# Patient Record
Sex: Female | Born: 1950 | Race: White | Hispanic: No | Marital: Married | Smoking: Former smoker
Health system: Southern US, Community
[De-identification: ages and names within clinical notes are randomized; demographics above are authoritative.]

## PROBLEM LIST (undated history)

## (undated) DIAGNOSIS — N393 Stress incontinence (female) (male): Secondary | ICD-10-CM

## (undated) DIAGNOSIS — F419 Anxiety disorder, unspecified: Secondary | ICD-10-CM

## (undated) DIAGNOSIS — M1990 Unspecified osteoarthritis, unspecified site: Secondary | ICD-10-CM

## (undated) DIAGNOSIS — Z860101 Personal history of adenomatous and serrated colon polyps: Secondary | ICD-10-CM

## (undated) DIAGNOSIS — N8110 Cystocele, unspecified: Secondary | ICD-10-CM

## (undated) DIAGNOSIS — Z86010 Personal history of colonic polyps: Secondary | ICD-10-CM

## (undated) DIAGNOSIS — R112 Nausea with vomiting, unspecified: Secondary | ICD-10-CM

## (undated) DIAGNOSIS — E785 Hyperlipidemia, unspecified: Secondary | ICD-10-CM

## (undated) DIAGNOSIS — K219 Gastro-esophageal reflux disease without esophagitis: Secondary | ICD-10-CM

## (undated) DIAGNOSIS — E079 Disorder of thyroid, unspecified: Secondary | ICD-10-CM

## (undated) DIAGNOSIS — E039 Hypothyroidism, unspecified: Secondary | ICD-10-CM

## (undated) DIAGNOSIS — Z973 Presence of spectacles and contact lenses: Secondary | ICD-10-CM

## (undated) DIAGNOSIS — Z9889 Other specified postprocedural states: Secondary | ICD-10-CM

## (undated) DIAGNOSIS — G4733 Obstructive sleep apnea (adult) (pediatric): Secondary | ICD-10-CM

## (undated) DIAGNOSIS — F32A Depression, unspecified: Secondary | ICD-10-CM

## (undated) HISTORY — DX: Disorder of thyroid, unspecified: E07.9

## (undated) HISTORY — PX: OTHER SURGICAL HISTORY: SHX169

## (undated) HISTORY — PX: LASIK: SHX215

## (undated) HISTORY — PX: MEDIAL PARTIAL KNEE REPLACEMENT: SHX5965

## (undated) HISTORY — PX: REPLACEMENT TOTAL KNEE: SUR1224

## (undated) HISTORY — PX: ABDOMINAL HYSTERECTOMY: SHX81

---

## 1998-10-06 HISTORY — PX: LAPAROSCOPIC ASSISTED VAGINAL HYSTERECTOMY: SHX5398

## 2006-10-06 HISTORY — PX: LAPAROSCOPIC CHOLECYSTECTOMY: SUR755

## 2013-10-06 HISTORY — PX: PARTIAL KNEE ARTHROPLASTY: SHX2174

## 2015-10-07 HISTORY — PX: REPLACEMENT TOTAL KNEE: SUR1224

## 2018-10-06 DIAGNOSIS — Z87898 Personal history of other specified conditions: Secondary | ICD-10-CM

## 2018-10-06 HISTORY — DX: Personal history of other specified conditions: Z87.898

## 2019-10-07 HISTORY — PX: COLONOSCOPY: SHX174

## 2019-11-20 ENCOUNTER — Ambulatory Visit: Payer: Medicare Other | Attending: Internal Medicine

## 2019-11-20 DIAGNOSIS — Z23 Encounter for immunization: Secondary | ICD-10-CM | POA: Insufficient documentation

## 2019-11-20 NOTE — Progress Notes (Signed)
   Covid-19 Vaccination Clinic  Name:  Colleen Conley    MRN: [id number] DOB: March 15, 1951  11/20/2019  [name] was observed post Covid-19 immunization for 15 minutes without incidence. She was provided with Vaccine Information Sheet and instruction to access the V-Safe system.   [name] was instructed to call 911 with any severe reactions post vaccine: Marland Kitchen Difficulty breathing  . Swelling of your face and throat  . A fast heartbeat  . A bad rash all over your body  . Dizziness and weakness    Immunizations Administered    Name Date Dose VIS Date Route   Pfizer COVID-19 Vaccine 11/20/2019  9:00 AM 0.3 mL 09/16/2019 Intramuscular   Manufacturer: [employer], [employer]   Lot: [id number]   NDC: [address]-0

## 2019-12-14 ENCOUNTER — Ambulatory Visit: Payer: Medicare Other | Attending: Internal Medicine

## 2019-12-14 DIAGNOSIS — Z23 Encounter for immunization: Secondary | ICD-10-CM

## 2019-12-14 NOTE — Progress Notes (Signed)
   Covid-19 Vaccination Clinic  Name:  Colleen Conley    MRN: [id number] DOB: 1951-06-20  12/14/2019  [name] was observed post Covid-19 immunization for 15 minutes without incident. She was provided with Vaccine Information Sheet and instruction to access the V-Safe system.   [name] was instructed to call 911 with any severe reactions post vaccine: Marland Kitchen Difficulty breathing  . Swelling of face and throat  . A fast heartbeat  . A bad rash all over body  . Dizziness and weakness   Immunizations Administered    Name Date Dose VIS Date Route   Pfizer COVID-19 Vaccine 12/14/2019  1:03 PM 0.3 mL 09/16/2019 Intramuscular   Manufacturer: [employer], [employer]   Lot: [id number]   NDC: [address]-0

## 2020-02-06 ENCOUNTER — Encounter: Payer: Self-pay | Admitting: Podiatry

## 2020-02-06 ENCOUNTER — Other Ambulatory Visit: Payer: Self-pay

## 2020-02-06 ENCOUNTER — Ambulatory Visit (INDEPENDENT_AMBULATORY_CARE_PROVIDER_SITE_OTHER): Payer: Medicare Other | Admitting: Podiatry

## 2020-02-06 VITALS — Temp 98.0°F

## 2020-02-06 DIAGNOSIS — M79675 Pain in left toe(s): Secondary | ICD-10-CM

## 2020-02-06 DIAGNOSIS — L600 Ingrowing nail: Secondary | ICD-10-CM | POA: Diagnosis not present

## 2020-02-07 ENCOUNTER — Encounter: Payer: Self-pay | Admitting: Podiatry

## 2020-02-07 NOTE — Progress Notes (Signed)
  Subjective:  Patient ID: Colleen Conley, female    DOB: [birth year],  MRN: [id number]  Chief Complaint  Patient presents with  . Nail Problem    Patient presents today for ingrown toenail left hallux medial border x months.    69 y.o. female presents with the above complaint.  Patient presents with complaint of left hallux medial ingrown that has been causing a lot of pain.  Patient states that this has been going on for quite some time.  It is tender to touch.  SHe has not tried any other treatment options.  She would like to have the cornea removed.  She denies any other acute complaints.   Review of Systems: Negative except as noted in the HPI. Denies N/V/F/Ch.  No past medical history on file.  Current Outpatient Medications:  .  famotidine (PEPCID) 40 MG tablet, Take by mouth., Disp: , Rfl:  .  levothyroxine (EUTHYROX) 100 MCG tablet, Take 1 tablet by mouth once daily, Disp: , Rfl:  .  LORazepam (ATIVAN) 0.5 MG tablet, Take by mouth., Disp: , Rfl:  .  sertraline (ZOLOFT) 25 MG tablet, Take 1 tablet by mouth once daily, Disp: , Rfl:  .  simvastatin (ZOCOR) 20 MG tablet, Take 1 tablet by mouth nightly, Disp: , Rfl:  .  omeprazole (PRILOSEC OTC) 20 MG tablet, Take by mouth., Disp: , Rfl:   Social History   Tobacco Use  Smoking Status Former Smoker  Smokeless Tobacco Never Used    No Known Allergies Objective:   Vitals:   02/06/20 1409  Temp: 98 F (36.7 C)   There is no height or weight on file to calculate BMI. Constitutional Well developed. Well nourished.  Vascular Dorsalis pedis pulses palpable bilaterally. Posterior tibial pulses palpable bilaterally. Capillary refill normal to all digits.  No cyanosis or clubbing noted. Pedal hair growth normal.  Neurologic Normal speech. Oriented to person, place, and time. Epicritic sensation to light touch grossly present bilaterally.  Dermatologic Painful ingrowing nail at medial nail borders of the hallux nail left. No  other open wounds. No skin lesions.  Orthopedic: Normal joint ROM without pain or crepitus bilaterally. No visible deformities. No bony tenderness.   Radiographs: None Assessment:   1. Ingrown left big toenail   2. Great toe pain, left    Plan:  Patient was evaluated and treated and all questions answered.  Ingrown Nail, left -Patient elects to proceed with minor surgery to remove ingrown toenail removal today. Consent reviewed and signed by patient. -Ingrown nail excised. See procedure note. -Educated on post-procedure care including soaking. Written instructions provided and reviewed. -Patient to follow up in 2 weeks for nail check.  Procedure: Excision of Ingrown Toenail Location: Left 1st toe medial nail borders. Anesthesia: Lidocaine 1% plain; 1.5 mL and Marcaine 0.5% plain; 1.5 mL, digital block. Skin Prep: Betadine. Dressing: Silvadene; telfa; dry, sterile, compression dressing. Technique: Following skin prep, the toe was exsanguinated and a tourniquet was secured at the base of the toe. The affected nail border was freed, split with a nail splitter, and excised. Chemical matrixectomy was then performed with phenol and irrigated out with alcohol. The tourniquet was then removed and sterile dressing applied. Disposition: Patient tolerated procedure well. Patient to return in 2 weeks for follow-up.   No follow-ups on file.

## 2020-11-19 ENCOUNTER — Other Ambulatory Visit: Payer: Self-pay | Admitting: Family Medicine

## 2020-11-19 DIAGNOSIS — Z1231 Encounter for screening mammogram for malignant neoplasm of breast: Secondary | ICD-10-CM

## 2020-12-06 ENCOUNTER — Ambulatory Visit
Admission: RE | Admit: 2020-12-06 | Discharge: 2020-12-06 | Disposition: A | Discharge status: Home / Self Care | Payer: Medicare Other | Source: Ambulatory Visit | Attending: Family Medicine | Admitting: Family Medicine

## 2020-12-06 ENCOUNTER — Other Ambulatory Visit: Payer: Self-pay

## 2020-12-06 DIAGNOSIS — Z1231 Encounter for screening mammogram for malignant neoplasm of breast: Secondary | ICD-10-CM | POA: Diagnosis not present

## 2021-06-13 ENCOUNTER — Encounter: Payer: Self-pay | Admitting: Family Medicine

## 2021-06-13 ENCOUNTER — Ambulatory Visit (INDEPENDENT_AMBULATORY_CARE_PROVIDER_SITE_OTHER): Payer: Medicare Other | Admitting: Family Medicine

## 2021-06-13 ENCOUNTER — Other Ambulatory Visit: Payer: Self-pay

## 2021-06-13 VITALS — BP 118/75 | HR 66 | Ht 68.0 in | Wt 174.0 lb

## 2021-06-13 DIAGNOSIS — K219 Gastro-esophageal reflux disease without esophagitis: Secondary | ICD-10-CM | POA: Insufficient documentation

## 2021-06-13 DIAGNOSIS — E785 Hyperlipidemia, unspecified: Secondary | ICD-10-CM | POA: Insufficient documentation

## 2021-06-13 DIAGNOSIS — E039 Hypothyroidism, unspecified: Secondary | ICD-10-CM | POA: Insufficient documentation

## 2021-06-13 DIAGNOSIS — F419 Anxiety disorder, unspecified: Secondary | ICD-10-CM | POA: Insufficient documentation

## 2021-06-13 DIAGNOSIS — N8111 Cystocele, midline: Secondary | ICD-10-CM

## 2021-06-13 DIAGNOSIS — F32A Depression, unspecified: Secondary | ICD-10-CM | POA: Insufficient documentation

## 2021-06-13 NOTE — Progress Notes (Addendum)
   Subjective:    Patient ID: Colleen Conley is a 70 y.o. 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 Starting to leak urine with sneezing or coughing. Has large bulge if not using her pessary. Has tried [employer]. She has h/o using a Number 5 pessary cube for a long time and would like that replaced. She was told that she was not a candidate for mesh. She is not sexually active. SVD x 3, largest baby 8.5 lbs.  Review of Systems  Constitutional:  Negative for chills and fever.  Respiratory:  Negative for shortness of breath.   Cardiovascular:  Negative for chest pain.  Gastrointestinal:  Negative for abdominal pain, nausea and vomiting.  Genitourinary:  Negative for dysuria.  Skin:  Negative for rash.     Objective:    BP 118/75   Pulse 66   Ht 5\' 8"  (1.727 m)   Wt 174 lb (78.9 kg)   BMI 26.46 kg/m  Physical Exam Exam conducted with a chaperone present.  Constitutional:      General: She is not in acute distress.    Appearance: She is well-developed.  HENT:     Head: Normocephalic and atraumatic.  Eyes:     General: No scleral icterus. Cardiovascular:     Rate and Rhythm: Normal rate.  Pulmonary:     Effort: Pulmonary effort is normal.  Abdominal:     Palpations: Abdomen is soft.  Genitourinary:    Comments: Large bulge noted at introitus, c/w cystocele and probable rectocele and side walls seem to collapse in as well. Uterus/cervix are surgically absent. Musculoskeletal:     Cervical back: Neck supple.  Skin:    General: Skin is warm and dry.  Neurological:     Mental Status: She is alert and oriented to person, place, and time.        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       Relevant Orders   Ambulatory referral to Urogynecology    Return in about 3 months (around 09/12/2021).  14/05/2021 06/13/2021 3:04 PM

## 2021-06-13 NOTE — Progress Notes (Signed)
Would like a new measurement for a new pessary

## 2021-06-13 NOTE — Assessment & Plan Note (Addendum)
+   rectocele and possible vaginal vault prolapse--she is happy with her pessary (will order a new one) and will send to URO/GYN for discussion of other treatment options.

## 2021-08-08 NOTE — Progress Notes (Signed)
[facility] New Patient Evaluation and Consultation  Referring Provider: Reva Bores, MD PCP: Jerl Mina, MD Date of Service: 08/09/2021  SUBJECTIVE Chief Complaint: New Patient (Initial Visit) Colleen Conley is a 70 y.o. female here for a consult on prolapse. Pt would like to explore other options.) Treatment for prolapse History of Present Illness: Colleen Conley is a 70 y.o. White or Caucasian female seen in consultation at the request of Dr. Shawnie Pons for evaluation of prolapse and incontinence.    Review of records from Dr Shawnie Pons significant for: S/p LAVH. Has leakage of urine with cough or sneeze. She is using a #5 cube pessary  Urinary Symptoms: Do you leak urine? Yes  When do you leak urine? cough/sneeze   exercise   movement to bathroom  How many times do you leak in one day?  1 to 2  For leakage protection, do you use liners/minipads  If using leakage protection, how many do you use in one day? one  Are you bothered by your leakage? Yes  How many times do you urinate in the daytime? 2 to 3  How many times do you wake up at night to urinate? 1 to 2  When you urinate, does it feel like you empty your bladder completely? Yes  Do you use a catheter to help empty your bladder? No   When urinating, she feels dribbling after finishing  UTIs:  0  UTI's in the last year.   Denies history of blood in urine and kidney or bladder stones  Pelvic Organ Prolapse Symptoms:                  She Admits to a feeling of a bulge the vaginal area. It has been present for 20 years.  She Admits to seeing a bulge.  This bulge is bothersome. Currently using cube pessary. Takes it out every day.   Bowel Symptom: How often do you have a bowel movement? 1 to 2 times day  What is the consistency of your stools? soft  Do you strain to empty your bowels? Yes  Do you have to push on your rectum or vagina to be able to empty your bowels? No  Do you have difficulty completely  emptying your rectum during a bowel movement? Yes  Do you ever leak bowel contents? Yes  How often do you leak bowel contents? once in awhile after eating tomato based food  When you leak stool, what is the consistency? liquid  Please enter the date of your last colonoscopy 2021  What were the results of your last colonoscopy? some polyps    Sexual Function Sexually active: no.   Pelvic Pain Denies pelvic pain  Past Medical History:  Past Medical History:  Diagnosis Date   Thyroid disease      Past Surgical History:   Past Surgical History:  Procedure Laterality Date   ABDOMINAL HYSTERECTOMY     MEDIAL PARTIAL KNEE REPLACEMENT     REPLACEMENT TOTAL KNEE       Past OB/GYN History: OB History  Gravida Para Term Preterm AB Living  3 3       3   SAB IAB Ectopic Multiple Live Births          3    # Outcome Date GA Lbr Len/2nd Weight Sex Delivery Anes PTL Lv  3 Para           2 Para  1 Para             Vaginal deliveries: 3,  Forceps/ Vacuum deliveries: 0, Cesarean section: 0 S/p hysterectomy   Medications: She has a current medication list which includes the following prescription(s): levothyroxine, omeprazole, sertraline, and simvastatin.   Allergies: Patient has No Known Allergies.   Social History:  Social History   Tobacco Use   Smoking status: Former   Smokeless tobacco: Never  Building services engineer Use: Never used  Substance Use Topics   Alcohol use: Yes    Comment: social drinker   Drug use: Never    What is your current relationship status? Married  Who do you live with? Fabio Asa (spouse)  Are you currently working? No  Do you exercise regularly? No  Have you ever been emotionally, physically or sexually abused? No  Do you feel safe in your current relationship? Yes    Family History:   Family History  Problem Relation Age of Onset   Breast cancer Maternal Aunt    Breast cancer Other      Review of Systems: Review of  Systems  Constitutional:  Negative for fever, malaise/fatigue and weight loss.  Respiratory:  Negative for cough, shortness of breath and wheezing.   Cardiovascular:  Negative for chest pain, palpitations and leg swelling.  Gastrointestinal:  Negative for abdominal pain and blood in stool.  Genitourinary:  Negative for dysuria.  Musculoskeletal:  Negative for myalgias.  Skin:  Negative for rash.  Neurological:  Negative for dizziness and headaches.  Endo/Heme/Allergies:  Does not bruise/bleed easily.  Psychiatric/Behavioral:  Negative for depression. The patient is not nervous/anxious.     OBJECTIVE Physical Exam: Vitals:   08/09/21 1039  BP: (!) 147/79  Pulse: (!) 51  Weight: 172 lb (78 kg)  Height: 5\' 8"  (1.727 m)    Physical Exam Constitutional:      General: She is not in acute distress. Pulmonary:     Effort: Pulmonary effort is normal.  Abdominal:     General: There is no distension.     Palpations: Abdomen is soft.     Tenderness: There is no abdominal tenderness. There is no rebound.  Musculoskeletal:        General: No swelling. Normal range of motion.  Skin:    General: Skin is warm and dry.     Findings: No rash.  Neurological:     Mental Status: She is alert and oriented to person, place, and time.  Psychiatric:        Mood and Affect: Mood normal.        Behavior: Behavior normal.     GU / Detailed Urogynecologic Evaluation:  Pelvic Exam: Normal external female genitalia; Bartholin's and Skene's glands normal in appearance; urethral meatus normal in appearance, no urethral masses or discharge.   CST: negative  s/p hysterectomy: Speculum exam reveals normal vaginal mucosa with  atrophy and normal vaginal cuff.  Adnexa no mass, fullness, tenderness.     Pelvic floor strength II/V, puborectalis III/V external anal sphincter IV/V  Pelvic floor musculature: Right levator non-tender, Right obturator non-tender, Left levator non-tender, Left obturator  non-tender  POP-Q:   POP-Q  -2                                            Aa   -2  Ba  -5                                              C   5                                            Gh  4                                            Pb  7.5                                            tvl   2                                            Ap  2                                            Bp                                                 D     Rectal Exam:  Normal sphincter tone, moderate distal rectocele, enterocoele present, no rectal masses  Post-Void Residual (PVR) by Bladder Scan: In order to evaluate bladder emptying, we discussed obtaining a postvoid residual and she agreed to this procedure.  Procedure: The ultrasound unit was placed on the patient's abdomen in the suprapubic region after the patient had voided. A PVR of 8 ml was obtained by bladder scan.  Laboratory Results: POC urine: negative   ASSESSMENT AND PLAN Ms. Sutphin is a 70 y.o. with:  1. Prolapse of posterior vaginal wall   2. Vaginal vault prolapse after hysterectomy   3. SUI (stress urinary incontinence, female)   4. Urinary frequency    Stage I anterior, Stage III posterior, Stage I apical prolapse - For treatment of pelvic organ prolapse, we discussed options for management including expectant management, conservative management, and surgical management, such as Kegels, a pessary, pelvic floor physical therapy, and specific surgical procedures. - She is interested in surgery. We discussed two options for prolapse repair:  1) vaginal repair without mesh - Pros - safer, no mesh complications - Cons - not as strong as mesh repair, higher risk of recurrence  2) laparoscopic repair with mesh - Pros - stronger, better long-term success - Cons - risks of mesh implant (erosion into vagina or bladder, adhering to the rectum, pain) - these risks are lower than  with a vaginal mesh but still exist - Handouts provided on both options.   2. SUI - For treatment of stress urinary incontinence,  non-surgical options include expectant  management, weight loss, physical therapy, as well as a pessary.  Surgical options include a midurethral sling, Burch urethropexy, and transurethral injection of a bulking agent. - She will undergo urodynamic testing to demonstrate leakage to see if she is a candidate for an anti-incontinence procedure  3. Urgency/ frequency - less of an issue, rare  Return for urodynamic testing  Marguerita Beards, MD

## 2021-08-09 ENCOUNTER — Other Ambulatory Visit: Payer: Self-pay

## 2021-08-09 ENCOUNTER — Encounter: Payer: Self-pay | Admitting: Obstetrics and Gynecology

## 2021-08-09 ENCOUNTER — Ambulatory Visit (INDEPENDENT_AMBULATORY_CARE_PROVIDER_SITE_OTHER): Payer: Medicare Other | Admitting: Obstetrics and Gynecology

## 2021-08-09 VITALS — BP 147/79 | HR 51 | Ht 68.0 in | Wt 172.0 lb

## 2021-08-09 DIAGNOSIS — R350 Frequency of micturition: Secondary | ICD-10-CM

## 2021-08-09 DIAGNOSIS — N993 Prolapse of vaginal vault after hysterectomy: Secondary | ICD-10-CM

## 2021-08-09 DIAGNOSIS — N393 Stress incontinence (female) (male): Secondary | ICD-10-CM | POA: Diagnosis not present

## 2021-08-09 DIAGNOSIS — N816 Rectocele: Secondary | ICD-10-CM

## 2021-08-09 LAB — POCT URINALYSIS DIPSTICK
Appearance: ABNORMAL
Glucose, UA: NEGATIVE
Ketones, UA: NEGATIVE
Leukocytes, UA: NEGATIVE
Nitrite, UA: NEGATIVE
Protein, UA: NEGATIVE
Spec Grav, UA: >=1.03 — AB (ref 1.010–1.025)
Urobilinogen, UA: 0.2 E.U./dL
pH, UA: 5.5 (ref 5.0–8.0)

## 2021-08-09 NOTE — Patient Instructions (Signed)

## 2021-09-24 ENCOUNTER — Ambulatory Visit (INDEPENDENT_AMBULATORY_CARE_PROVIDER_SITE_OTHER): Payer: Medicare Other | Admitting: Obstetrics and Gynecology

## 2021-09-24 ENCOUNTER — Other Ambulatory Visit: Payer: Self-pay

## 2021-09-24 VITALS — BP 136/79 | HR 48

## 2021-09-24 DIAGNOSIS — R350 Frequency of micturition: Secondary | ICD-10-CM | POA: Diagnosis not present

## 2021-09-24 LAB — POCT URINALYSIS DIPSTICK
Appearance: NORMAL
Bilirubin, UA: NEGATIVE
Blood, UA: NEGATIVE
Glucose, UA: NEGATIVE
Ketones, UA: NEGATIVE
Leukocytes, UA: NEGATIVE
Nitrite, UA: NEGATIVE
Protein, UA: NEGATIVE
Spec Grav, UA: 1.025 (ref 1.010–1.025)
Urobilinogen, UA: 0.2 E.U./dL
pH, UA: 5 (ref 5.0–8.0)

## 2021-09-24 NOTE — Patient Instructions (Signed)

## 2021-09-27 NOTE — Progress Notes (Signed)
[facility] Urodynamics Procedure  Referring Physician: Jerl Mina, MD Date of Procedure: 09/24/2021  Colleen Conley is a 70 y.o. female who presents for urodynamic evaluation. Indication(s) for study: mixed incontinence  Vital Signs: BP 136/79    Pulse (!) 48   Laboratory Results: A catheterized urine specimen revealed:  POC urine: negative  Voiding Diary: Not performed  Procedure Timeout:  The correct patient was verified and the correct procedure was verified. The patient was in the correct position and safety precautions were reviewed based on at the patient's history.  Urodynamic Procedure A 35F dual lumen urodynamics catheter was placed under sterile conditions into the patient's bladder. A 35F catheter was placed into the rectum in order to measure abdominal pressure. EMG patches were placed in the appropriate position.  All connections were confirmed and calibrations/adjusted made. Saline was instilled into the bladder through the dual lumen catheters.  Cough/valsalva pressures were measured periodically during filling.  Patient was allowed to void.  The bladder was then emptied of its residual.  UROFLOW: Revealed a Qmax of 1 mL/sec.  She voided 2 mL and had a residual of 20 mL.  Unable to comment on flow pattern due to low voided volume.   CMG: This was performed with sterile water in the sitting position at a fill rate of 30 mL/min.    First sensation of fullness was 129 mLs,  First urge was 159 mLs,  Strong urge was 197 mLs and  Capacity was 279 mLs  Stress incontinence was not demonstrated Highest negative Barrier CLPP was 117 cmH20 at capacity in the standing position Highest negative Barrier VLPP was 36 cmH20 at capacity  Detrusor function was normal, with no phasic contractions seen.   Compliance:  normal. End fill detrusor pressure was 5.5cmH20.  Calculated compliance was 139mL/cmH20  UPP: MUCP with barrier reduction was 51.5 cm of water.     MICTURITION STUDY: Voiding was performed with reduction using scopettes in the sitting position.  Pdet at Qmax was 30 cm of water.  Qmax was 13 mL/sec.  It was a interrupted pattern.  She voided 261 mL and had a residual of 18 mL.  It was a volitional void, sustained detrusor contraction was present and abdominal straining was present  EMG: This was performed with patches.  She had voluntary contractions, recruitment with fill was present and urethral sphincter was relaxed with void.  The details of the procedure with the study tracings have been scanned into EPIC.   Urodynamic Impression:  1. Sensation was normal; capacity was reduced 2. Stress Incontinence was not demonstrated. 3. Detrusor Overactivity was not demonstrated. 4. Emptying was normal with a normal PVR, a sustained detrusor contraction present,  abdominal straining present, normal urethral sphincter activity on EMG.  Plan: - The patient will follow up  to discuss the findings and treatment options.

## 2021-10-25 ENCOUNTER — Encounter: Payer: Self-pay | Admitting: Obstetrics and Gynecology

## 2021-10-25 ENCOUNTER — Other Ambulatory Visit: Payer: Self-pay

## 2021-10-25 ENCOUNTER — Ambulatory Visit (INDEPENDENT_AMBULATORY_CARE_PROVIDER_SITE_OTHER): Payer: Medicare Other | Admitting: Obstetrics and Gynecology

## 2021-10-25 VITALS — BP 116/77 | HR 95

## 2021-10-25 DIAGNOSIS — N816 Rectocele: Secondary | ICD-10-CM | POA: Diagnosis not present

## 2021-10-25 DIAGNOSIS — N993 Prolapse of vaginal vault after hysterectomy: Secondary | ICD-10-CM

## 2021-10-25 NOTE — Patient Instructions (Addendum)
General Surgical Risks: For all procedures, there are risks of bleeding, infection, damage to surrounding organs including but not limited to bowel, bladder, blood vessels, ureters and nerves, and need for further surgery if an injury were to occur. These risks are all low with minimally invasive surgery.   There are risks of numbness and weakness at any body site or buttock/rectal pain.  It is possible that baseline pain can be worsened by surgery, either with or without mesh. If surgery is vaginal, there is also a low risk of possible conversion to laparoscopy or open abdominal incision where indicated. Very rare risks include blood transfusion, blood clot, heart attack, pneumonia, or death.   There is also a risk of short-term postoperative urinary retention with need to use a catheter. About half of patients need to go home from surgery with a catheter, which is then later removed in the office. The risk of long-term need for a catheter is very low. There is also a risk of worsening of overactive bladder.   Prolapse (with or without mesh): Risk factors for surgical failure  include things that put pressure on your pelvis and the surgical repair, including obesity, chronic cough, and heavy lifting or straining (including lifting children or adults, straining on the toilet, or lifting heavy objects such as furniture or anything weighing >25 lbs. Risks of recurrence is 20-30% with vaginal native tissue repair and a less than 10% with sacrocolpopexy with mesh.    Sacrocolpopexy: Mesh implants may provide more prolapse support, but do have some unique risks to consider. It is important to understand that mesh is permanent and cannot be easily removed. Risks of abdominal sacrocolpopexy mesh include mesh exposure (~3-6%), painful intercourse (recent studies show lower rates after surgery compared to before, with ~5-8% risk of new onset), and very rare risks of bowel or bladder injury or infection (<1%). The  risk of mesh exposure is more likely in a woman with risks for poor healing (prior radiation, poorly controlled diabetes, or immunocompromised). The risk of new or worsened chronic pain after mesh implant is more common in women with baseline chronic pain and/or poorly controlled anxiety or depression. There is an FDA safety notification on vaginal mesh procedures for prolapse but NOT abdominal mesh procedures and therefore does not apply to your surgery. We have extensive experience and training with mesh placement and we have close postoperative follow up to identify any potential complications from mesh.     POST OPERATIVE INSTRUCTIONS  General Instructions Recovery (not bed rest) will last approximately 6 weeks Walking is encouraged, but refrain from strenuous exercise/ housework/ heavy lifting. No lifting >10lbs  Nothing in the vagina- NO intercourse, tampons or douching Bathing:  Do not submerge in water (NO swimming, bath, hot tub, etc) until after your postop visit. You can shower starting the day after surgery.  No driving until you are not taking narcotic pain medicine and until your pain is well enough controlled that you can slam on the breaks or make sudden movements if needed.   Taking your medications Please take your acetaminophen and ibuprofen on a schedule for the first 48 hours. Take 600mg  ibuprofen, then take 500mg  acetaminophen 3 hours later, then continue to alternate ibuprofen and acetaminophen. That way you are taking each type of medication every 6 hours. Take the prescribed narcotic (oxycodone, tramadol, etc) as needed, with a maximum being every 4 hours.  Take a stool softener daily to keep your stools soft and preventing you from straining.  If you have diarrhea, you decrease your stool softener. This is explained more below. We have prescribed you Miralax.  Reasons to Call the Nurse (see last page for phone numbers) Heavy Bleeding (changing your pad every 1-2  hours) Persistent nausea/vomiting Fever (100.4 degrees or more) Incision problems (pus or other fluid coming out, redness, warmth, increased pain)  Things to Expect After Surgery Mild to Moderate pain is normal during the first day or two after surgery. If prescribed, take Ibuprofen or Tylenol first and use the stronger medicine for break-through pain. You can overlap these medicines because they work differently.   Constipation   To Prevent Constipation:  Eat a well-balanced diet including protein, grains, fresh fruit and vegetables.  Drink plenty of fluids. Walk regularly.  Depending on specific instructions from your physician: take Miralax daily and additionally you can add a stool softener (colace/ docusate) and fiber supplement. Continue as long as you're on pain medications.   To Treat Constipation:  If you do not have a bowel movement in 2 days after surgery, you can take 2 Tbs of Milk of Magnesia 1-2 times a day until you have a bowel movement. If diarrhea occurs, decrease the amount or stop the laxative. If no results with Milk of Magnesia, you can drink a bottle of magnesium citrate which you can purchase over the counter.  Fatigue:  This is a normal response to surgery and will improve with time.  Plan frequent rest periods throughout the day.  Gas Pain:  This is very common but can also be very painful! Drink warm liquids such as herbal teas, bouillon or soup. Walking will help you pass more gas.  Mylicon or Gas-X can be taken over the counter.  Leaking Urine:  Varying amounts of leakage may occur after surgery.  This should improve with time. Your bladder needs at least 3 months to recover from surgery. If you leak after surgery, be sure to mention this to your doctor at your post-op visit. If you were taking medications for overactive bladder prior to surgery, be sure to restart the medications immediately after surgery.  Incisions: If you have incisions on your abdomen, the skin  glue will dissolve on its own over time. It is ok to gently rinse with soap and water over these incisions but do not scrub.  Catheter Approximately 50% of patients are unable to urinate after surgery and need to go home with a catheter. This allows your bladder to rest so it can return to full function. If you go home with a catheter, the office will call to set up a voiding trial a few days after surgery. For most patients, by this visit, they are able to urinate on their own. Long term catheter use is rare.   Return to Work  As work demands and recovery times vary widely, it is hard to predict when you will want to return to work. If you have a desk job with no strenuous physical activity, and if you would like to return sooner than generally recommended, discuss this with your provider or call our office.   Post op concerns  For non-emergent issues, please call the Urogynecology Nurse. Please leave a message and someone will contact you within one business day.  You can also send a message through MyChart.   AFTER HOURS (After 5:00 PM and on weekends):  For urgent matters that cannot wait until the next business day. Call our office [phone] and connect to the doctor on  call.  Please reserve this for important issues.   **FOR ANY TRUE EMERGENCY ISSUES CALL 911 OR GO TO THE NEAREST EMERGENCY ROOM.** Please inform our office or the doctor on call of any emergency.     APPOINTMENTS: Call [phone]

## 2021-10-25 NOTE — Progress Notes (Signed)
[facility] Return Visit  SUBJECTIVE  History of Present Illness: Colleen Conley is a 71 y.o. female seen in follow-up for to discuss surgery after urodynamic testing.   Urodynamic Impression:  1. Sensation was normal; capacity was reduced 2. Stress Incontinence was not demonstrated. 3. Detrusor Overactivity was not demonstrated. 4. Emptying was normal with a normal PVR, a sustained detrusor contraction present,  abdominal straining present, normal urethral sphincter activity on EMG.  Past Medical History: Patient  has a past medical history of Thyroid disease.   Past Surgical History: She  has a past surgical history that includes Abdominal hysterectomy; Replacement total knee; and Medial partial knee replacement.   Medications: She has a current medication list which includes the following prescription(s): levothyroxine, omeprazole, sertraline, and simvastatin.   Allergies: Patient has No Known Allergies.   Social History: Patient  reports that she has quit smoking. She has never used smokeless tobacco. She reports current alcohol use. She reports that she does not use drugs.      OBJECTIVE     Physical Exam: Vitals:   10/25/21 0838  BP: 116/77  Pulse: 95   Gen: No apparent distress, A&O x 3.  Detailed Urogynecologic Evaluation:  Deferred. Prior exam showed:  POP-Q (08/09/21):    POP-Q   -2                                            Aa   -2                                           Ba   -5                                              C    5                                            Gh   4                                            Pb   7.5                                            tvl    2                                            Ap   2                                            Bp  D         ASSESSMENT AND PLAN    [name] is a 71 y.o. 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 Prolapse of posterior vaginal wall     Plan for surgery: Exam under anesthesia, robotic sacrocolpopexy, cystoscopy  - We reviewed the patient's specific anatomic and functional findings, with the assistance of diagrams, and together finalized the above procedure. The planned surgical procedures were discussed along with the surgical risks outlined below, which were also provided on a detailed handout. Additional treatment options including expectant management, conservative management, medical management were discussed where appropriate.  We reviewed the benefits and risks of each treatment option.  - Urodynamic testing did not demonstrate need for anti-incontinence procedure.   General Surgical Risks: For all procedures, there are risks of bleeding, infection, damage to surrounding organs including but not limited to bowel, bladder, blood vessels, ureters and nerves, and need for further surgery if an injury were to occur. These risks are all low with minimally invasive surgery.   There are risks of numbness and weakness at any body site or buttock/rectal pain.  It is possible that baseline pain can be worsened by surgery, either with or without mesh. If surgery is vaginal, there is also a low risk of possible conversion to laparoscopy or open abdominal incision where indicated. Very rare risks include blood transfusion, blood clot, heart attack, pneumonia, or death.   There is also a risk of short-term postoperative urinary retention with need to use a catheter. About half of patients need to go home from surgery with a catheter, which is then later removed in the office. The risk of long-term need for a catheter is very low. There is also a risk of worsening of overactive bladder.   Prolapse (with or without mesh): Risk factors for surgical failure  include things that put pressure on your pelvis and the surgical repair, including obesity, chronic cough, and heavy lifting  or straining (including lifting children or adults, straining on the toilet, or lifting heavy objects such as furniture or anything weighing >25 lbs. Risks of recurrence is 20-30% with vaginal native tissue repair and a less than 10% with sacrocolpopexy with mesh.    Sacrocolpopexy: Mesh implants may provide more prolapse support, but do have some unique risks to consider. It is important to understand that mesh is permanent and cannot be easily removed. Risks of abdominal sacrocolpopexy mesh include mesh exposure (~3-6%), painful intercourse (recent studies show lower rates after surgery compared to before, with ~5-8% risk of new onset), and very rare risks of bowel or bladder injury or infection (<1%). The risk of mesh exposure is more likely in a woman with risks for poor healing (prior radiation, poorly controlled diabetes, or immunocompromised). The risk of new or worsened chronic pain after mesh implant is more common in women with baseline chronic pain and/or poorly controlled anxiety or depression. There is an FDA safety notification on vaginal mesh procedures for prolapse but NOT abdominal mesh procedures and therefore does not apply to your surgery. We have extensive experience and training with mesh placement and we have close postoperative follow up to identify any potential complications from mesh.    - For preop Visit:  She is required to have a visit within 30 days of her surgery.   Today we reviewed pre-operative preparation, peri-operative expectations, and post-operative instructions/recovery.  She was provided with instructional handouts. She understands not to take aspirin (>81mg ) or NSAIDs  7 days prior to surgery. Prescriptions provided for: Oxycodone 5mg , Ibuprofen 600mg , Tylenol 500mg , Miralax. These prescriptions will be sent prior to surgery.  - Medical clearance: not required  - Anticoagulant use: No - Medicaid Hysterectomy form: No - Accepts blood transfusion: Yes - Expected  length of stay: outpatient  Request sent for surgery scheduling.   , MD  Time spent: I spent 30 minutes dedicated to the care of this patient on the date of this encounter to include pre-visit review of records, face-to-face time with the patient and post visit documentation.

## 2021-11-25 NOTE — Progress Notes (Signed)
[facility] Pre-Operative visit  Subjective Chief Complaint: Colleen Conley presents for a preoperative encounter.   History of Present Illness: Colleen Conley is a 71 y.o. female who presents for preoperative visit.  She is scheduled to undergo Robotic assisted laparoscopic sacrocolpopexy, cystoscopy, possible posterior repair and perineorrhaphy on 12/17/21.  Her symptoms include vaginal bulge, and she was was found to have Stage I anterior, Stage III posterior, Stage I apical prolapse.  Urodynamics showed: 1. Sensation was normal; capacity was reduced 2. Stress Incontinence was not demonstrated. 3. Detrusor Overactivity was not demonstrated. 4. Emptying was normal with a normal PVR, a sustained detrusor contraction present,  abdominal straining present, normal urethral sphincter activity on EMG.  Past Medical History:  Diagnosis Date   Thyroid disease      Past Surgical History:  Procedure Laterality Date   ABDOMINAL HYSTERECTOMY     MEDIAL PARTIAL KNEE REPLACEMENT     REPLACEMENT TOTAL KNEE      has No Known Allergies.   Family History  Problem Relation Age of Onset   Breast cancer Maternal Aunt    Breast cancer Other     Social History   Tobacco Use   Smoking status: Former   Smokeless tobacco: Never  Vaping Use   Vaping Use: Never used  Substance Use Topics   Alcohol use: Yes    Comment: social drinker   Drug use: Never     Review of Systems was negative for a full 10 system review except as noted in the History of Present Illness.   Current Outpatient Medications:    levothyroxine (SYNTHROID) 100 MCG tablet, Take 1 tablet by mouth once daily, Disp: , Rfl:    omeprazole (PRILOSEC OTC) 20 MG tablet, Take by mouth., Disp: , Rfl:    sertraline (ZOLOFT) 25 MG tablet, Take 1 tablet by mouth once daily, Disp: , Rfl:    simvastatin (ZOCOR) 20 MG tablet, Take 1 tablet by mouth nightly, Disp: , Rfl:    Objective Vitals:   11/26/21 1301  BP:  112/66  Pulse: 79    Gen: NAD CV: S1 S2 RRR Lungs: Clear to auscultation bilaterally Abd: soft, nontender   Previous Pelvic Exam showed: POP-Q (08/09/21):    POP-Q   -2                                            Aa   -2                                           Ba   -5                                              C    5                                            Gh   4  Pb   7.5                                            tvl    2                                            Ap   2                                            Bp                                                  D          Assessment/ Plan  Assessment: The patient is a 71 y.o. year old scheduled to undergo Robotic assisted laparoscopic sacrocolpopexy, cystoscopy, possible posterior repair and perineorrhaphy. Verbal consent was obtained for these procedures.  Plan: General Surgical Consent: The patient has previously been counseled on alternative treatments, and the decision by the patient and provider was to proceed with the procedure listed above.  For all procedures, there are risks of bleeding, infection, damage to surrounding organs including but not limited to bowel, bladder, blood vessels, ureters and nerves, and need for further surgery if an injury were to occur. These risks are all low with minimally invasive surgery.   There are risks of numbness and weakness at any body site or buttock/rectal pain.  It is possible that baseline pain can be worsened by surgery, either with or without mesh. If surgery is vaginal, there is also a low risk of possible conversion to laparoscopy or open abdominal incision where indicated. Very rare risks include blood transfusion, blood clot, heart attack, pneumonia, or death.   There is also a risk of short-term postoperative urinary retention with need to use a catheter. About half of patients need to go home from surgery  with a catheter, which is then later removed in the office. The risk of long-term need for a catheter is very low. There is also a risk of worsening of overactive bladder.     Prolapse (with or without mesh): Risk factors for surgical failure  include things that put pressure on your pelvis and the surgical repair, including obesity, chronic cough, and heavy lifting or straining (including lifting children or adults, straining on the toilet, or lifting heavy objects such as furniture or anything weighing >25 lbs. Risks of recurrence is 20-30% with vaginal native tissue repair and a less than 10% with sacrocolpopexy with mesh.    Sacrocolpopexy: Mesh implants may provide more prolapse support, but do have some unique risks to consider. It is important to understand that mesh is permanent and cannot be easily removed. Risks of abdominal sacrocolpopexy mesh include mesh exposure (~3-6%), painful intercourse (recent studies show lower rates after surgery compared to before, with ~5-8% risk of new onset), and very rare risks of bowel or bladder injury or infection (<1%). The risk of mesh exposure is more likely in a woman with risks for  poor healing (prior radiation, poorly controlled diabetes, or immunocompromised). The risk of new or worsened chronic pain after mesh implant is more common in women with baseline chronic pain and/or poorly controlled anxiety or depression. There is an FDA safety notification on vaginal mesh procedures for prolapse but NOT abdominal mesh procedures and therefore does not apply to your surgery. We have extensive experience and training with mesh placement and we have close postoperative follow up to identify any potential complications from mesh.    We discussed consent for blood products. Risks for blood transfusion include allergic reactions, other reactions that can affect different body organs and managed accordingly, transmission of infectious diseases such as HIV or  Hepatitis. However, the blood is screened. Patient consents for blood products.  Pre-operative instructions:  She was instructed to not take Aspirin/NSAIDs x 7days prior to surgery. Antibiotic prophylaxis was ordered as indicated.  Cathter use: Patient will go home with foley if needed after post-operative voiding trial.  Post-operative instructions:  She was provided with specific post-operative instructions, including precautions and signs/symptoms for which we would recommend contacting us, in addition to daytime and after-hours contact phone numbers. This was provided on a handout.   Post-operative medications: Prescriptions for motrin, tylenol, miralax, and oxycodone were sent to her pharmacy. Discussed using ibuprofen and tylenol on a schedule to limit use of narcotics.   Laboratory testing:  Type and screen   Preoperative clearance:  She does not require surgical clearance.    Post-operative follow-up:  A post-operative appointment will be made for 6 weeks from the date of surgery. If she needs a post-operative nurse visit for a voiding trial, that will be set up after she leaves the hospital.    Patient will call the clinic or use MyChart should anything change or any new issues arise.   Marguerita Beards, MD

## 2021-11-26 ENCOUNTER — Encounter: Payer: Self-pay | Admitting: Obstetrics and Gynecology

## 2021-11-26 ENCOUNTER — Other Ambulatory Visit: Payer: Self-pay

## 2021-11-26 ENCOUNTER — Ambulatory Visit (INDEPENDENT_AMBULATORY_CARE_PROVIDER_SITE_OTHER): Payer: Medicare Other | Admitting: Obstetrics and Gynecology

## 2021-11-26 VITALS — BP 112/66 | HR 79

## 2021-11-26 DIAGNOSIS — N993 Prolapse of vaginal vault after hysterectomy: Secondary | ICD-10-CM

## 2021-11-26 NOTE — H&P (Signed)
[facility] Pre-Operative H&P  Subjective Chief Complaint: Colleen Conley presents for a preoperative encounter.   History of Present Illness: Colleen Conley is a 71 y.o. female who presents for preoperative visit.  She is scheduled to undergo Robotic assisted laparoscopic sacrocolpopexy, cystoscopy, possible posterior repair and perineorrhaphy on 12/17/21.  Her symptoms include vaginal bulge, and she was was found to have Stage I anterior, Stage III posterior, Stage I apical prolapse.  Urodynamics showed: 1. Sensation was normal; capacity was reduced 2. Stress Incontinence was not demonstrated. 3. Detrusor Overactivity was not demonstrated. 4. Emptying was normal with a normal PVR, a sustained detrusor contraction present,  abdominal straining present, normal urethral sphincter activity on EMG.  Past Medical History:  Diagnosis Date   Thyroid disease      Past Surgical History:  Procedure Laterality Date   ABDOMINAL HYSTERECTOMY     MEDIAL PARTIAL KNEE REPLACEMENT     REPLACEMENT TOTAL KNEE      has No Known Allergies.   Family History  Problem Relation Age of Onset   Breast cancer Maternal Aunt    Breast cancer Other     Social History   Tobacco Use   Smoking status: Former   Smokeless tobacco: Never  Vaping Use   Vaping Use: Never used  Substance Use Topics   Alcohol use: Yes    Comment: social drinker   Drug use: Never     Review of Systems was negative for a full 10 system review except as noted in the History of Present Illness.  No current facility-administered medications for this encounter.  Current Outpatient Medications:    levothyroxine (SYNTHROID) 100 MCG tablet, Take 1 tablet by mouth once daily, Disp: , Rfl:    omeprazole (PRILOSEC OTC) 20 MG tablet, Take by mouth., Disp: , Rfl:    sertraline (ZOLOFT) 25 MG tablet, Take 1 tablet by mouth once daily, Disp: , Rfl:    simvastatin (ZOCOR) 20 MG tablet, Take 1 tablet by mouth nightly,  Disp: , Rfl:    Objective There were no vitals filed for this visit.   Gen: NAD CV: S1 S2 RRR Lungs: Clear to auscultation bilaterally Abd: soft, nontender   Previous Pelvic Exam showed: POP-Q (08/09/21):    POP-Q   -2                                            Aa   -2                                           Ba   -5                                              C    5                                            Gh   4  Pb   7.5                                            tvl    2                                            Ap   2                                            Bp                                                  D          Assessment/ Plan  Assessment: The patient is a 71 y.o. year old with stage III POP scheduled to undergo Robotic assisted laparoscopic sacrocolpopexy, cystoscopy, possible posterior repair and perineorrhaphy.   Jaquita Folds, MD

## 2021-11-29 ENCOUNTER — Other Ambulatory Visit: Payer: Self-pay | Admitting: Obstetrics and Gynecology

## 2021-11-29 DIAGNOSIS — Z01818 Encounter for other preprocedural examination: Secondary | ICD-10-CM

## 2021-11-29 MED ORDER — IBUPROFEN 600 MG PO TABS: 600.0000 mg | ORAL_TABLET | Freq: Four times a day (QID) | ORAL | 0 refills | Status: DC | PRN

## 2021-11-29 MED ORDER — OXYCODONE HCL 5 MG PO TABS: 5.0000 mg | ORAL_TABLET | ORAL | 0 refills | Status: DC | PRN

## 2021-11-29 MED ORDER — POLYETHYLENE GLYCOL 3350 17 GM/SCOOP PO POWD: 17.0000 g | Freq: Every day | ORAL | 0 refills | Status: DC

## 2021-11-29 MED ORDER — ACETAMINOPHEN 500 MG PO TABS: 500.0000 mg | ORAL_TABLET | Freq: Four times a day (QID) | ORAL | 0 refills | Status: DC | PRN

## 2021-12-10 ENCOUNTER — Encounter (HOSPITAL_BASED_OUTPATIENT_CLINIC_OR_DEPARTMENT_OTHER): Payer: Self-pay | Admitting: Obstetrics and Gynecology

## 2021-12-10 ENCOUNTER — Other Ambulatory Visit: Payer: Self-pay

## 2021-12-10 NOTE — Progress Notes (Signed)
Spoke w/ via phone for pre-op interview--- pt ?Lab needs dos----  t&s             ?Lab results------ no ?COVID test -----patient states asymptomatic no test needed ?Arrive at ------- 0530 on 12-17-2021 ?NPO after MN NO Solid Food.  Clear liquids from MN until--- 0430 ?Med rec completed ?Medications to take morning of surgery ----- np thyroid ?Diabetic medication ----- n/a ?Patient instructed no nail polish to be worn day of surgery ?Patient instructed to bring photo id and insurance card day of surgery ?Patient aware to have Driver (ride ) / caregiver for 24 hours after surgery -- daughter, Carollee Herter labella ?Patient Special Instructions ----- n/a ?Pre-Op special Istructions ----- n/a ?Patient verbalized understanding of instructions that were given at this phone interview. ?Patient denies shortness of breath, chest pain, fever, cough at this phone interview.  ?

## 2021-12-16 NOTE — Anesthesia Preprocedure Evaluation (Signed)
Anesthesia Evaluation    History of Anesthesia Complications (+) PONV and history of anesthetic complications  Airway        Dental   Pulmonary sleep apnea , former smoker,           Cardiovascular      Neuro/Psych PSYCHIATRIC DISORDERS Anxiety Depression    GI/Hepatic GERD  Medicated,  Endo/Other  Hypothyroidism   Renal/GU      Musculoskeletal  (+) Arthritis , Osteoarthritis,    Abdominal   Peds  Hematology   Anesthesia Other Findings   Reproductive/Obstetrics                             Anesthesia Physical Anesthesia Plan  ASA: 2  Anesthesia Plan: General   Post-op Pain Management: Dilaudid IV   Induction: Intravenous  PONV Risk Score and Plan: 4 or greater and Ondansetron, Dexamethasone, Midazolam and Scopolamine patch - Pre-op  Airway Management Planned: Oral ETT  Additional Equipment: None  Intra-op Plan:   Post-operative Plan:   Informed Consent:   Plan Discussed with:   Anesthesia Plan Comments:         Anesthesia Quick Evaluation

## 2021-12-17 ENCOUNTER — Other Ambulatory Visit: Payer: Self-pay

## 2021-12-17 ENCOUNTER — Encounter (HOSPITAL_BASED_OUTPATIENT_CLINIC_OR_DEPARTMENT_OTHER): Payer: Self-pay | Admitting: Obstetrics and Gynecology

## 2021-12-17 ENCOUNTER — Telehealth: Payer: Self-pay | Admitting: Obstetrics and Gynecology

## 2021-12-17 ENCOUNTER — Observation Stay (HOSPITAL_BASED_OUTPATIENT_CLINIC_OR_DEPARTMENT_OTHER)
Admission: RE | Admit: 2021-12-17 | Discharge: 2021-12-17 | Disposition: A | Discharge status: Home / Self Care | Payer: Medicare Other | Attending: Obstetrics and Gynecology | Admitting: Obstetrics and Gynecology

## 2021-12-17 ENCOUNTER — Encounter (HOSPITAL_BASED_OUTPATIENT_CLINIC_OR_DEPARTMENT_OTHER)
Admission: RE | Disposition: A | Discharge status: Home / Self Care | Payer: Self-pay | Source: Home / Self Care | Attending: Obstetrics and Gynecology

## 2021-12-17 ENCOUNTER — Ambulatory Visit (HOSPITAL_BASED_OUTPATIENT_CLINIC_OR_DEPARTMENT_OTHER): Payer: Medicare Other | Admitting: Certified Registered Nurse Anesthetist

## 2021-12-17 DIAGNOSIS — N736 Female pelvic peritoneal adhesions (postinfective): Secondary | ICD-10-CM | POA: Diagnosis not present

## 2021-12-17 DIAGNOSIS — N816 Rectocele: Secondary | ICD-10-CM | POA: Diagnosis not present

## 2021-12-17 DIAGNOSIS — Z96659 Presence of unspecified artificial knee joint: Secondary | ICD-10-CM | POA: Diagnosis not present

## 2021-12-17 DIAGNOSIS — N993 Prolapse of vaginal vault after hysterectomy: Principal | ICD-10-CM | POA: Diagnosis present

## 2021-12-17 DIAGNOSIS — N819 Female genital prolapse, unspecified: Secondary | ICD-10-CM

## 2021-12-17 DIAGNOSIS — Z87891 Personal history of nicotine dependence: Secondary | ICD-10-CM | POA: Diagnosis not present

## 2021-12-17 DIAGNOSIS — N8111 Cystocele, midline: Secondary | ICD-10-CM

## 2021-12-17 HISTORY — DX: Hypothyroidism, unspecified: E03.9

## 2021-12-17 HISTORY — DX: Cystocele, unspecified: N81.10

## 2021-12-17 HISTORY — DX: Presence of spectacles and contact lenses: Z97.3

## 2021-12-17 HISTORY — DX: Personal history of adenomatous and serrated colon polyps: Z86.0101

## 2021-12-17 HISTORY — DX: Hyperlipidemia, unspecified: E78.5

## 2021-12-17 HISTORY — DX: Stress incontinence (female) (male): N39.3

## 2021-12-17 HISTORY — DX: Obstructive sleep apnea (adult) (pediatric): G47.33

## 2021-12-17 HISTORY — DX: Unspecified osteoarthritis, unspecified site: M19.90

## 2021-12-17 HISTORY — DX: Personal history of colonic polyps: Z86.010

## 2021-12-17 HISTORY — DX: Other specified postprocedural states: Z98.890

## 2021-12-17 HISTORY — DX: Gastro-esophageal reflux disease without esophagitis: K21.9

## 2021-12-17 HISTORY — DX: Nausea with vomiting, unspecified: R11.2

## 2021-12-17 LAB — TYPE AND SCREEN
ABO/RH(D): O POS
Antibody Screen: NEGATIVE

## 2021-12-17 LAB — ABO/RH: ABO/RH(D): O POS

## 2021-12-17 SURGERY — SACROCOLPOPEXY, ROBOT-ASSISTED, LAPAROSCOPIC
Anesthesia: General | Laterality: Right

## 2021-12-17 MED ORDER — PHENYLEPHRINE 40 MCG/ML (10ML) SYRINGE FOR IV PUSH (FOR BLOOD PRESSURE SUPPORT)
PREFILLED_SYRINGE | INTRAVENOUS | Status: DC | PRN
  Administered 2021-12-17 (×2): 80 ug via INTRAVENOUS

## 2021-12-17 MED ORDER — FENTANYL CITRATE (PF) 250 MCG/5ML IJ SOLN
INTRAMUSCULAR | Status: DC | PRN
Start: 2021-12-17 — End: 2021-12-17
  Administered 2021-12-17: 25 ug via INTRAVENOUS
  Administered 2021-12-17 (×2): 50 ug via INTRAVENOUS
  Administered 2021-12-17: 25 ug via INTRAVENOUS
  Administered 2021-12-17: 50 ug via INTRAVENOUS

## 2021-12-17 MED ORDER — SCOPOLAMINE 1 MG/3DAYS TD PT72
MEDICATED_PATCH | TRANSDERMAL | Status: AC
  Filled 2021-12-17: qty 1

## 2021-12-17 MED ORDER — ONDANSETRON HCL 4 MG/2ML IJ SOLN
INTRAMUSCULAR | Status: DC | PRN
Start: 2021-12-17 — End: 2021-12-17
  Administered 2021-12-17: 4 mg via INTRAVENOUS

## 2021-12-17 MED ORDER — GABAPENTIN 300 MG PO CAPS
300.0000 mg | ORAL_CAPSULE | ORAL | Status: AC
  Administered 2021-12-17: 300 mg via ORAL

## 2021-12-17 MED ORDER — ROCURONIUM BROMIDE 10 MG/ML (PF) SYRINGE
PREFILLED_SYRINGE | INTRAVENOUS | Status: AC
  Filled 2021-12-17: qty 10

## 2021-12-17 MED ORDER — DEXAMETHASONE SODIUM PHOSPHATE 10 MG/ML IJ SOLN
INTRAMUSCULAR | Status: DC | PRN
  Administered 2021-12-17: 10 mg via INTRAVENOUS

## 2021-12-17 MED ORDER — ONDANSETRON HCL 4 MG PO TABS: 4.0000 mg | ORAL_TABLET | Freq: Four times a day (QID) | ORAL | Status: DC | PRN

## 2021-12-17 MED ORDER — GLYCOPYRROLATE PF 0.2 MG/ML IJ SOSY
PREFILLED_SYRINGE | INTRAMUSCULAR | Status: DC | PRN
  Administered 2021-12-17: .2 mg via INTRAVENOUS

## 2021-12-17 MED ORDER — MIDAZOLAM HCL 2 MG/2ML IJ SOLN
INTRAMUSCULAR | Status: DC | PRN
  Administered 2021-12-17 (×2): 1 mg via INTRAVENOUS

## 2021-12-17 MED ORDER — ACETAMINOPHEN 325 MG PO TABS: 325.0000 mg | ORAL_TABLET | ORAL | Status: DC | PRN

## 2021-12-17 MED ORDER — KETOROLAC TROMETHAMINE 30 MG/ML IJ SOLN
30.0000 mg | Freq: Once | INTRAMUSCULAR | Status: AC
  Administered 2021-12-17: 30 mg via INTRAVENOUS

## 2021-12-17 MED ORDER — PHENAZOPYRIDINE HCL 100 MG PO TABS
200.0000 mg | ORAL_TABLET | ORAL | Status: AC
  Administered 2021-12-17: 200 mg via ORAL

## 2021-12-17 MED ORDER — PROPOFOL 10 MG/ML IV BOLUS
INTRAVENOUS | Status: DC | PRN
  Administered 2021-12-17: 150 mg via INTRAVENOUS

## 2021-12-17 MED ORDER — GABAPENTIN 300 MG PO CAPS
ORAL_CAPSULE | ORAL | Status: AC
  Filled 2021-12-17: qty 1

## 2021-12-17 MED ORDER — SCOPOLAMINE 1 MG/3DAYS TD PT72
MEDICATED_PATCH | TRANSDERMAL | Status: DC | PRN
  Administered 2021-12-17: 1 via TRANSDERMAL

## 2021-12-17 MED ORDER — KETOROLAC TROMETHAMINE 30 MG/ML IJ SOLN
INTRAMUSCULAR | Status: AC
Start: 2021-12-17 — End: ?
  Filled 2021-12-17: qty 1

## 2021-12-17 MED ORDER — BUPIVACAINE HCL (PF) 0.25 % IJ SOLN
INTRAMUSCULAR | Status: DC | PRN
  Administered 2021-12-17: 12 mL

## 2021-12-17 MED ORDER — SODIUM CHLORIDE 0.9 % IR SOLN
Status: DC | PRN
Start: 2021-12-17 — End: 2021-12-17
  Administered 2021-12-17: 1000 mL
  Administered 2021-12-17: 1000 mL via INTRAVESICAL

## 2021-12-17 MED ORDER — PROPOFOL 10 MG/ML IV BOLUS
INTRAVENOUS | Status: AC
  Filled 2021-12-17: qty 20

## 2021-12-17 MED ORDER — DEXAMETHASONE SODIUM PHOSPHATE 10 MG/ML IJ SOLN
INTRAMUSCULAR | Status: AC
  Filled 2021-12-17: qty 1

## 2021-12-17 MED ORDER — ONDANSETRON HCL 4 MG/2ML IJ SOLN: 4.0000 mg | Freq: Four times a day (QID) | INTRAMUSCULAR | Status: DC | PRN

## 2021-12-17 MED ORDER — LACTATED RINGERS IV SOLN
INTRAVENOUS | Status: DC
  Administered 2021-12-17: 1000 mL via INTRAVENOUS

## 2021-12-17 MED ORDER — OXYCODONE HCL 5 MG/5ML PO SOLN: 5.0000 mg | Freq: Once | ORAL | Status: DC | PRN

## 2021-12-17 MED ORDER — LIDOCAINE HCL (PF) 2 % IJ SOLN
INTRAMUSCULAR | Status: AC
  Filled 2021-12-17: qty 5

## 2021-12-17 MED ORDER — LIDOCAINE HCL (PF) 2 % IJ SOLN
INTRAMUSCULAR | Status: DC | PRN
  Administered 2021-12-17: 1.5 mg/kg/h via INTRADERMAL

## 2021-12-17 MED ORDER — POVIDONE-IODINE 10 % EX SWAB: 2.0000 "application " | Freq: Once | CUTANEOUS | Status: DC

## 2021-12-17 MED ORDER — MEPERIDINE HCL 25 MG/ML IJ SOLN: 6.2500 mg | INTRAMUSCULAR | Status: DC | PRN

## 2021-12-17 MED ORDER — ACETAMINOPHEN 500 MG PO TABS
1000.0000 mg | ORAL_TABLET | ORAL | Status: AC
  Administered 2021-12-17: 1000 mg via ORAL

## 2021-12-17 MED ORDER — CEFAZOLIN SODIUM-DEXTROSE 2-4 GM/100ML-% IV SOLN
INTRAVENOUS | Status: AC
  Filled 2021-12-17: qty 100

## 2021-12-17 MED ORDER — LIDOCAINE 2% (20 MG/ML) 5 ML SYRINGE
INTRAMUSCULAR | Status: DC | PRN
Start: 2021-12-17 — End: 2021-12-17
  Administered 2021-12-17: 100 mg via INTRAVENOUS

## 2021-12-17 MED ORDER — ONDANSETRON HCL 4 MG/2ML IJ SOLN: 4.0000 mg | Freq: Once | INTRAMUSCULAR | Status: DC | PRN

## 2021-12-17 MED ORDER — ROCURONIUM BROMIDE 10 MG/ML (PF) SYRINGE
PREFILLED_SYRINGE | INTRAVENOUS | Status: DC | PRN
Start: 2021-12-17 — End: 2021-12-17
  Administered 2021-12-17 (×2): 20 mg via INTRAVENOUS
  Administered 2021-12-17: 60 mg via INTRAVENOUS

## 2021-12-17 MED ORDER — OXYCODONE HCL 5 MG PO TABS: 5.0000 mg | ORAL_TABLET | ORAL | Status: DC | PRN

## 2021-12-17 MED ORDER — POLYETHYLENE GLYCOL 3350 17 G PO PACK: 17.0000 g | PACK | Freq: Every day | ORAL | Status: DC

## 2021-12-17 MED ORDER — OXYCODONE HCL 5 MG PO TABS: 5.0000 mg | ORAL_TABLET | Freq: Once | ORAL | Status: DC | PRN

## 2021-12-17 MED ORDER — PHENAZOPYRIDINE HCL 100 MG PO TABS
ORAL_TABLET | ORAL | Status: AC
  Filled 2021-12-17: qty 2

## 2021-12-17 MED ORDER — ACETAMINOPHEN 500 MG PO TABS
ORAL_TABLET | ORAL | Status: AC
  Filled 2021-12-17: qty 2

## 2021-12-17 MED ORDER — CEFAZOLIN SODIUM-DEXTROSE 2-4 GM/100ML-% IV SOLN
2.0000 g | INTRAVENOUS | Status: AC
  Administered 2021-12-17: 2 g via INTRAVENOUS

## 2021-12-17 MED ORDER — MIDAZOLAM HCL 2 MG/2ML IJ SOLN
INTRAMUSCULAR | Status: AC
  Filled 2021-12-17: qty 2

## 2021-12-17 MED ORDER — GABAPENTIN 100 MG PO CAPS: 100.0000 mg | ORAL_CAPSULE | Freq: Two times a day (BID) | ORAL | Status: DC

## 2021-12-17 MED ORDER — PHENYLEPHRINE 40 MCG/ML (10ML) SYRINGE FOR IV PUSH (FOR BLOOD PRESSURE SUPPORT)
PREFILLED_SYRINGE | INTRAVENOUS | Status: AC
  Filled 2021-12-17: qty 10

## 2021-12-17 MED ORDER — ACETAMINOPHEN 325 MG PO TABS: 650.0000 mg | ORAL_TABLET | ORAL | Status: DC | PRN

## 2021-12-17 MED ORDER — ACETAMINOPHEN 160 MG/5ML PO SOLN: 325.0000 mg | ORAL | Status: DC | PRN

## 2021-12-17 MED ORDER — FENTANYL CITRATE (PF) 250 MCG/5ML IJ SOLN
INTRAMUSCULAR | Status: AC
  Filled 2021-12-17: qty 5

## 2021-12-17 MED ORDER — FENTANYL CITRATE (PF) 100 MCG/2ML IJ SOLN: 25.0000 ug | INTRAMUSCULAR | Status: DC | PRN

## 2021-12-17 MED ORDER — SUGAMMADEX SODIUM 200 MG/2ML IV SOLN
INTRAVENOUS | Status: DC | PRN
  Administered 2021-12-17: 200 mg via INTRAVENOUS

## 2021-12-17 MED ORDER — ONDANSETRON HCL 4 MG/2ML IJ SOLN
INTRAMUSCULAR | Status: AC
  Filled 2021-12-17: qty 2

## 2021-12-17 SURGICAL SUPPLY — 67 items
ADH SKN CLS APL DERMABOND .7 (GAUZE/BANDAGES/DRESSINGS) ×2
APL PRP STRL LF DISP 70% ISPRP (MISCELLANEOUS) ×2
CATH FOLEY 3WAY  5CC 16FR (CATHETERS) ×1
CATH FOLEY 3WAY 5CC 16FR (CATHETERS) ×2 IMPLANT
CHLORAPREP W/TINT 26 (MISCELLANEOUS) ×3 IMPLANT
COVER BACK TABLE 60X90IN (DRAPES) ×3 IMPLANT
COVER TIP SHEARS 8 DVNC (MISCELLANEOUS) ×2 IMPLANT
COVER TIP SHEARS 8MM DA VINCI (MISCELLANEOUS) ×1
DEFOGGER SCOPE WARMER CLEARIFY (MISCELLANEOUS) ×3 IMPLANT
DERMABOND ADVANCED (GAUZE/BANDAGES/DRESSINGS) ×1
DERMABOND ADVANCED .7 DNX12 (GAUZE/BANDAGES/DRESSINGS) ×2 IMPLANT
DRAPE ARM DVNC X/XI (DISPOSABLE) ×8 IMPLANT
DRAPE COLUMN DVNC XI (DISPOSABLE) ×2 IMPLANT
DRAPE DA VINCI XI ARM (DISPOSABLE) ×4
DRAPE DA VINCI XI COLUMN (DISPOSABLE) ×1
DRAPE SHEET LG 3/4 BI-LAMINATE (DRAPES) ×1 IMPLANT
DRAPE UTILITY XL STRL (DRAPES) ×3 IMPLANT
ELECT REM PT RETURN 9FT ADLT (ELECTROSURGICAL) ×3
ELECTRODE REM PT RTRN 9FT ADLT (ELECTROSURGICAL) ×2 IMPLANT
GAUZE 4X4 16PLY ~~LOC~~+RFID DBL (SPONGE) ×6 IMPLANT
GLOVE SURG ENC MOIS LTX SZ6 (GLOVE) ×12 IMPLANT
GLOVE SURG UNDER POLY LF SZ6.5 (GLOVE) ×15 IMPLANT
GOWN SPEC L4 XLG W/TWL (GOWN DISPOSABLE) ×1 IMPLANT
GOWN STRL REUS W/TWL LRG LVL3 (GOWN DISPOSABLE) ×3 IMPLANT
HIBICLENS CHG 4% 4OZ BTL (MISCELLANEOUS) ×3 IMPLANT
HOLDER FOLEY CATH W/STRAP (MISCELLANEOUS) ×3 IMPLANT
IRRIG SUCT STRYKERFLOW 2 WTIP (MISCELLANEOUS) ×3
IRRIGATION SUCT STRKRFLW 2 WTP (MISCELLANEOUS) ×2 IMPLANT
KIT TURNOVER CYSTO (KITS) ×3 IMPLANT
LEGGING LITHOTOMY PAIR STRL (DRAPES) ×3 IMPLANT
MANIFOLD NEPTUNE II (INSTRUMENTS) ×3 IMPLANT
MANIPULATOR ADVINCU DEL 2.5 PL (MISCELLANEOUS) IMPLANT
MANIPULATOR ADVINCU DEL 3.0 PL (MISCELLANEOUS) IMPLANT
MANIPULATOR ADVINCU DEL 3.5 PL (MISCELLANEOUS) IMPLANT
MANIPULATOR ADVINCU DEL 4.0 PL (MISCELLANEOUS) IMPLANT
MESH VERTESSA LITE -Y 2X4X3 (Mesh General) ×3 IMPLANT
NEEDLE INSUFFLATION 120MM (ENDOMECHANICALS) ×3 IMPLANT
OBTURATOR OPTICAL STANDARD 8MM (TROCAR) ×1
OBTURATOR OPTICAL STND 8 DVNC (TROCAR) ×2
OBTURATOR OPTICALSTD 8 DVNC (TROCAR) ×2 IMPLANT
PACK CYSTO (CUSTOM PROCEDURE TRAY) ×3 IMPLANT
PACK ROBOT WH (CUSTOM PROCEDURE TRAY) ×3 IMPLANT
PACK ROBOTIC GOWN (GOWN DISPOSABLE) ×3 IMPLANT
PAD OB MATERNITY 4.3X12.25 (PERSONAL CARE ITEMS) ×3 IMPLANT
PAD POSITIONING PINK XL (MISCELLANEOUS) ×3 IMPLANT
PAD PREP 24X48 CUFFED NSTRL (MISCELLANEOUS) ×3 IMPLANT
POUCH LAPAROSCOPIC INSTRUMENT (MISCELLANEOUS) ×1 IMPLANT
PROTECTOR NERVE ULNAR (MISCELLANEOUS) ×3 IMPLANT
SEAL CANN UNIV 5-8 DVNC XI (MISCELLANEOUS) ×8 IMPLANT
SEAL XI 5MM-8MM UNIVERSAL (MISCELLANEOUS) ×5
SET IRRIG Y TYPE TUR BLADDER L (SET/KITS/TRAYS/PACK) ×3 IMPLANT
SET TUBE SMOKE EVAC HIGH FLOW (TUBING) ×3 IMPLANT
SPONGE T-LAP 4X18 ~~LOC~~+RFID (SPONGE) IMPLANT
SUT ABS MONO DBL WITH NDL 48IN (SUTURE) IMPLANT
SUT DVC VLOC 180 2-0 12IN GS21 (SUTURE) ×6
SUT GORETEX NAB #0 THX26 36IN (SUTURE) ×1 IMPLANT
SUT MNCRL AB 4-0 PS2 18 (SUTURE) ×3 IMPLANT
SUT MON AB 2-0 SH 27 (SUTURE) ×3 IMPLANT
SUT VIC AB 2-0 SH 27 (SUTURE) ×6
SUT VIC AB 2-0 SH 27XBRD (SUTURE) IMPLANT
SUT VLOC 180 0 9IN  GS21 (SUTURE)
SUT VLOC 180 0 9IN GS21 (SUTURE) IMPLANT
SUT VLOC 180 2-0 6IN GS21 (SUTURE) ×1 IMPLANT
SUT VLOC 180 2-0 9IN GS21 (SUTURE) ×4 IMPLANT
SUTURE DVC VL 180 2-0 12INGS21 (SUTURE) IMPLANT
TOWEL OR 17X26 10 PK STRL BLUE (TOWEL DISPOSABLE) ×3 IMPLANT
TROCAR XCEL NON BLADE 8MM B8LT (ENDOMECHANICALS) ×3 IMPLANT

## 2021-12-17 NOTE — Telephone Encounter (Signed)
Completed.

## 2021-12-17 NOTE — Interval H&P Note (Signed)
History and Physical Interval Note: ? ?12/17/2021 ?7:12 AM ? ?Colleen Conley  has presented today for surgery, with the diagnosis of vaginal vault prolapse, posterior vaginal prolapse.  The various methods of treatment have been discussed with the patient and family. After consideration of risks, benefits and other options for treatment, the patient has consented to  Procedure(s) with comments: ?XI ROBOTIC ASSISTED LAPAROSCOPIC SACROCOLPOPEXY (N/A), possible posterior repair and perineorrhaphy, ?CYSTOSCOPY (N/A) as a surgical intervention.  The patient's history has been reviewed, patient examined, no change in status, stable for surgery.  I have reviewed the patient's chart and labs.  Questions were answered to the patient's satisfaction.   ? ? ?Marguerita Beards ? ? ?

## 2021-12-17 NOTE — Discharge Instructions (Addendum)

## 2021-12-17 NOTE — Anesthesia Procedure Notes (Deleted)
Procedure Name: Intubation ?Date/Time: 12/17/2021 7:34 AM ?Performed by: Clearnce Sorrel, CRNA ?Pre-anesthesia Checklist: Patient identified, Emergency Drugs available, Suction available and Patient being monitored ?Patient Re-evaluated:Patient Re-evaluated prior to induction ?Oxygen Delivery Method: Circle System Utilized ?Preoxygenation: Pre-oxygenation with 100% oxygen ?Induction Type: IV induction ?Ventilation: Mask ventilation without difficulty ?Laryngoscope Size: Mac and 3 ?Grade View: Grade I ?Tube type: Oral ?Tube size: 7.0 mm ?Number of attempts: 1 ?Airway Equipment and Method: Stylet and Bite block ?Placement Confirmation: ETT inserted through vocal cords under direct vision, positive ETCO2 and breath sounds checked- equal and bilateral ?Secured at: 22 cm ?Tube secured with: Tape ?Dental Injury: Teeth and Oropharynx as per pre-operative assessment  ? ? ? ? ?

## 2021-12-17 NOTE — Care Management CC44 (Signed)
Condition Code 44 Documentation Completed ? ?Patient Details  ?Name: Colleen Conley ?MRN: [id number] ?Date of Birth: Feb 12, 1951 ? ? ?Condition Code 44 given:   yes ?Patient signature on Condition Code 44 notice:  yes  ?Documentation of 2 MD's agreement:  yes  ?Code 44 added to claim:  yes  ? ? ? ?Roseanne Kaufman, RN ?12/17/2021, 1:17 PM ? ?

## 2021-12-17 NOTE — Progress Notes (Signed)
Dr Wannetta Sender called with voiding trial results. ?

## 2021-12-17 NOTE — Anesthesia Procedure Notes (Addendum)
Procedure Name: Intubation ?Date/Time: 12/17/2021 7:34 AM ?Performed by: Dairl Ponder, CRNA ?Pre-anesthesia Checklist: Patient identified, Emergency Drugs available, Suction available and Patient being monitored ?Patient Re-evaluated:Patient Re-evaluated prior to induction ?Oxygen Delivery Method: Circle System Utilized ?Preoxygenation: Pre-oxygenation with 100% oxygen ?Induction Type: IV induction ?Ventilation: Mask ventilation without difficulty ?Laryngoscope Size: 4 and 3 ?Grade View: Grade I ?Tube type: Oral ?Tube size: 7.0 mm ?Number of attempts: 1 ?Airway Equipment and Method: Stylet and Oral airway ?Placement Confirmation: ETT inserted through vocal cords under direct vision, positive ETCO2 and breath sounds checked- equal and bilateral ?Secured at: 23 cm ?Tube secured with: Tape ?Dental Injury: Teeth and Oropharynx as per pre-operative assessment  ? ? ? ? ?

## 2021-12-17 NOTE — Op Note (Addendum)
Operative Note ? ?Preoperative Diagnosis: anterior vaginal prolapse, posterior vaginal prolapse, and vaginal vault prolapse after hysterectomy ? ?Postoperative Diagnosis: same ? ?Procedures performed:  ?Robotic assisted lysis of adhesions, right salpingectomy, sacrocolpopexy, cystoscopy ? ?Implants:  ?Implant Name Type Inv. Item Serial No. Manufacturer Lot No. LRB No. Used Action  ?MESH Grayland Ormond [id number] - [id number] [employer] MESH Chesley Mires -Y [id number]  [facility] [phone] N/A 1 Implanted  ? ? ?Attending Surgeon: Lanetta Inch, MD ? ?Assistant Surgeon: Annamaria Boots, MD ? ?Anesthesia: General endotracheal ? ?Findings: 1. Right fallopian tube and ovary adherent to vaginal cuff.  ? 2. Normal appearing left fallopian tube and ovary ? 3. Adhesions of bowel to the right ovary and the right pelvic sidewall.   ? 4. On cystoscopy, normal bladder and urethra without injury or lesion, brisk bilateral ureteral efflux noted.  ? 5. On vaginal exam, stage III pelvic organ prolapse noted.  ? ?Specimens:  ?ID Type Source Tests Collected by Time Destination  ?1 : RIGHT FALLOPIAN TUBE Tissue PATH Soft tissue SURGICAL PATHOLOGY Marguerita Beards, MD 12/17/2021 1052   ? ? ?Estimated blood loss: 75 mL ? ?IV fluids: see flowsheet ? ?Urine output: see flowsheet ? ?Complications: none ? ?Procedure in Detail: ? ?After informed consent was obtained, the patient was taken to the operating room, where general anesthesia was induced and found to be adequate. She was placed in dorsolithotomy position in yellowfin stirrups. Her hips were noted not to be hyperflexed or hyperextended. Her arms were padded with gel pads and tucked to her sides. Her hands were surrounded by gel. A padded strap was placed across her chest with foam between the pad and her skin. She was noted to be appropriately positioned with all pressure points well padded and off tension. A tilt test showed no slippage. She was prepped and draped in the  usual sterile fashion.  A sterile Foley catheter was inserted.  ? ?0.25% plain Marcaine in the supraumbilical area and an incision was made with a 11 blade scalpel. A Veress needle was inserted into the incision, CO2 insufflation was started, a low opening pressure was noted, and pneumoperitoneum was obtained. The Veress needle was removed and a 33mm robotic trocar was placed. Entry into the peritoneal cavity was confirmed.  After determining placement for the other ports, Local anesthetic was injected at each site and two 8 mm incisions were made for robotic ports at 10 cm lateral to and at the level of the umbilical port. Two additional 8 mm incisions were made 10 cm lateral to these and 30 degrees down followed by 8 mm robotic ports - the right side for an assistant port. All trocars were placed sequentially under direct visualization of the camera. The patient was placed in Trendelenburg. The sacrum appeared to be free of any adhesive disease.The robot was docked on the patient's right side. Monopolar endoshears were placed in the right arm, a [place] bipolar grasper was placed in the 2nd arm of the patient's left side, and a Tip up grasper was placed in the 3rd arm on the patient's left side.  ?  ?With a lucite probe in the vagina, the vagina was identified.  The right fallopian tube was adherent to the vaginal cuff. The mesosalpinx was cauterized and cut and the fallopian tube removed. Additional adhesions of the ovary to the cuff and the small bowel to the ovary were removed with sharp dissection to create exposure. Anterior vaginal dissection was then performed with  sharp dissection and electrosurgery to separate the vesicovaginal space to the level of the urethra. Initially the vaginal muscularis was partially split during dissection, so a 2-0 vicryl was used to reapproximate the edges to ensure full thickness vaginal muscularis. The posterior vaginal dissection was then performed with sharp dissection and  electrosurgery in order to dissect the rectum away from the posterior vagina down to the perineum. The  Attention was then turned to the sacral promontory. The peritoneum overlying the sacral promontory was tented up, dissected sharply with monopolar scissors and electrosurgery using layer by layer technique. The overlying areolar and adipose tissue were taken down until the anterior longitudinal sacral ligament was identified. The peritoneal incision was extended down to the posterior cul-de-sac. This was performed with care to avoid the ureter on the right side and the sigmoid colon and its mesentary on the left side.   Small vessels were cauterized along the way to obtain excellent hemostasis. A "Y" mesh was then inserted into the abdomen after trimming to appropriate size. With the probe in the vagina, the anterior leaf of the Y mesh was affixed to the anterior portion of the vagina using a 2-0 v-loc suture in a spiral pattern to distribute the suture evenly across the surface of the anterior mesh leaf. In a similar fashion, the posterior leaf of the Y mesh was attached to the posterior surface of the vagina with 2-0 v-loc suture.  The distal end of the mesh was then brought to overlie the sacrum. The correct amount of tension was determined in order to elevate the vagina, but not put the mesh under tension. The distal end of the mesh was then affixed to the anterior longitudinal sacral ligament using two interrupted transverse stitches of CV2Gortex. The excess distal mesh was then cut and removed. The peritoneum was reapproximated over the mesh using 2-0 monocryl. The bladder flap was incorporated to completely retroperitonealize the mesh. All pedicles were carefully inspected and noted to be hemostatic as the CO2 gas was deflated. All instruments were removed from the patient's abdomen.  ?  ?The Foley catheter was removed.  A 70-degree cystoscope was introduced, and 360-degree inspection revealed no injury,  lesion or foreign body in the bladder. Brisk bilateral ureteral efflux was noted with the assistance of pyridium.  The bladder was drained and the cystoscope was removed.  The Foley catheter was replaced. ?  ?The robot was undocked. The CO2 gas was removed and the ports were removed.  The skin incisions were closed with subcutaneous stitches of 4-0 Monocryl and covered with skin glue.  ?  ?The patient tolerated the procedure well. Sponge, lap, and needle counts were correct x 2. She was awakened from anesthesia and transferred to the recovery room in stable condition.  ? ? ?Dr Rondel Baton assistance was required due to the complexity of the case and another assistant was not available.  ? ?Marguerita Beards, MD ? ?

## 2021-12-17 NOTE — Progress Notes (Signed)
Patient doing well no complaints she is ready for discharge.  ?

## 2021-12-17 NOTE — Care Management Obs Status (Signed)
MEDICARE OBSERVATION STATUS NOTIFICATION ? ? ?Patient Details  ?Name: Colleen Conley ?MRN: [id number] ?Date of Birth: 26-Sep-1951 ? ? ?Medicare Observation Status Notification Given:  yes  ? ? ? ?Lavenia Atlas, RN ?12/17/2021, 1:17 PM ?

## 2021-12-17 NOTE — Transfer of Care (Signed)
Immediate Anesthesia Transfer of Care Note ? ?Patient: Colleen Conley ? ?Procedure(s) Performed: XI ROBOTIC ASSISTED LAPAROSCOPIC SACROCOLPOPEXY ?CYSTOSCOPY ?UNILATERAL SALPINGECTOMY (Right) ? ?Patient Location: PACU ? ?Anesthesia Type:General ? ?Level of Consciousness: drowsy and patient cooperative ? ?Airway & Oxygen Therapy: Patient Spontanous Breathing and Patient connected to nasal cannula oxygen ? ?Post-op Assessment: Report given to RN and Post -op Vital signs reviewed and stable ? ?Post vital signs: Reviewed and stable ? ?Last Vitals:  ?Vitals Value Taken Time  ?BP 121/76 12/17/21 1120  ?Temp    ?Pulse 59 12/17/21 1124  ?Resp 15 12/17/21 1124  ?SpO2 98 % 12/17/21 1124  ?Vitals shown include unvalidated device data. ? ?Last Pain:  ?Vitals:  ? 12/17/21 0609  ?TempSrc: Oral  ?PainSc: 0-No pain  ?   ? ?Patients Stated Pain Goal: 7 (12/17/21 1696) ? ?Complications: No notable events documented. ?

## 2021-12-18 ENCOUNTER — Telehealth: Payer: Self-pay | Admitting: Obstetrics and Gynecology

## 2021-12-18 ENCOUNTER — Encounter (HOSPITAL_BASED_OUTPATIENT_CLINIC_OR_DEPARTMENT_OTHER): Payer: Self-pay | Admitting: Obstetrics and Gynecology

## 2021-12-18 LAB — SURGICAL PATHOLOGY

## 2021-12-18 NOTE — Telephone Encounter (Signed)
Colleen Conley underwent robotic assisted laparoscopic sacrocopopexy, cystoscopy on 12/17/21.  ? ?She passed her voiding trial.  ? was backfilled into the bladder ?Voided  ?PVR by bladder scan was 66ml.  ? ?She was discharged without a catheter. Please call her for a routine post op check. Thanks! ? ?Marguerita Beards, MD ? ?

## 2021-12-18 NOTE — Telephone Encounter (Signed)
Post- Op Call ? ?Colleen Conley underwent robotic assisted laparoscopic sacrocolpopexy on 12/17/21 with Dr Florian Buff. The patient reports that her pain is controlled. She is taking ibuprofen. She denies vaginal bleeding. She has not had a bowel movement and is taking miralax for a bowel regimen. She was discharged without a catheter. Advised to call with any problems or concerns.   ? ?Harrie Jeans, RN  ?

## 2021-12-19 NOTE — Anesthesia Postprocedure Evaluation (Signed)
Anesthesia Post Note ? ?Patient: Colleen Conley ? ?Procedure(s) Performed: XI ROBOTIC ASSISTED LAPAROSCOPIC SACROCOLPOPEXY ?CYSTOSCOPY ?UNILATERAL SALPINGECTOMY (Right) ? ?  ? ?Patient location during evaluation: PACU ?Anesthesia Type: General ?Level of consciousness: awake ?Pain management: pain level controlled ?Vital Signs Assessment: post-procedure vital signs reviewed and stable ?Respiratory status: spontaneous breathing ?Cardiovascular status: stable ?Postop Assessment: no apparent nausea or vomiting ?Anesthetic complications: no ? ? ?No notable events documented. ? ?Last Vitals:  ?Vitals:  ? 12/17/21 1315 12/17/21 1409  ?BP: 107/70 105/63  ?Pulse: (!) 57 71  ?Resp: 18 16  ?Temp: 36.6 ?C 36.6 ?C  ?SpO2: 93% 95%  ?  ?Last Pain:  ?Vitals:  ? 12/17/21 1500  ?TempSrc:   ?PainSc: 0-No pain  ? ? ?  ?  ?  ?  ?  ?  ? ?Caren Macadam ? ? ? ? ?

## 2021-12-24 ENCOUNTER — Encounter: Payer: Self-pay | Admitting: *Deleted

## 2021-12-25 IMAGING — MG MM DIGITAL SCREENING BILAT W/ TOMO AND CAD
8 series · 8 of 24 positions shown · non-contrast
Comparison: Previous exam(s).

CLINICAL DATA: Screening.

EXAM:
DIGITAL SCREENING BILATERAL MAMMOGRAM WITH TOMOSYNTHESIS AND CAD
TECHNIQUE: Bilateral screening digital craniocaudal and mediolateral oblique
mammograms were obtained. Bilateral screening digital breast
tomosynthesis was performed. The images were evaluated with
computer-aided detection.

[L CC synth-2D]
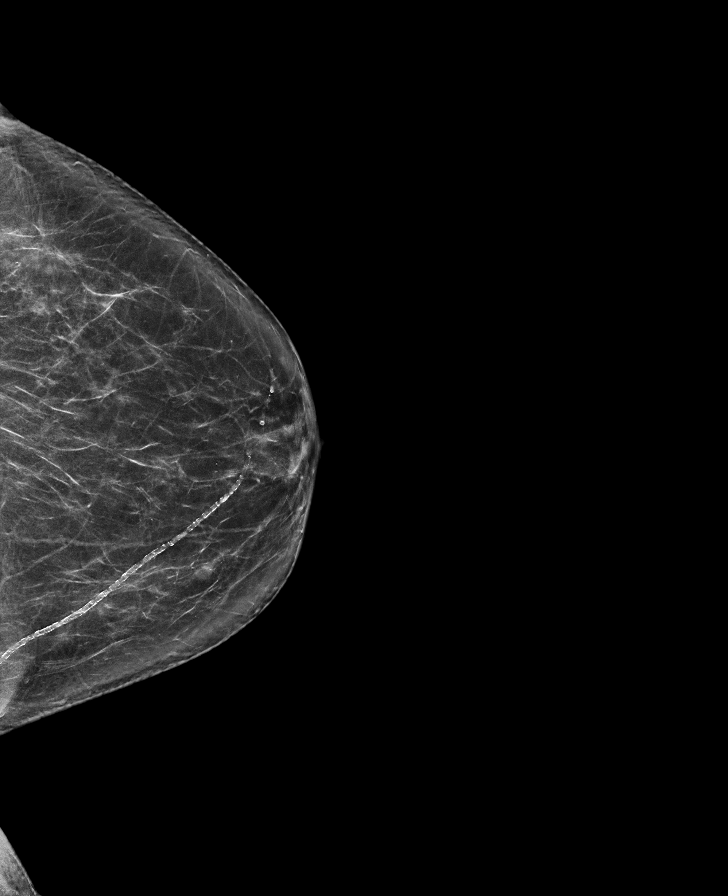

[R MLO synth-2D]
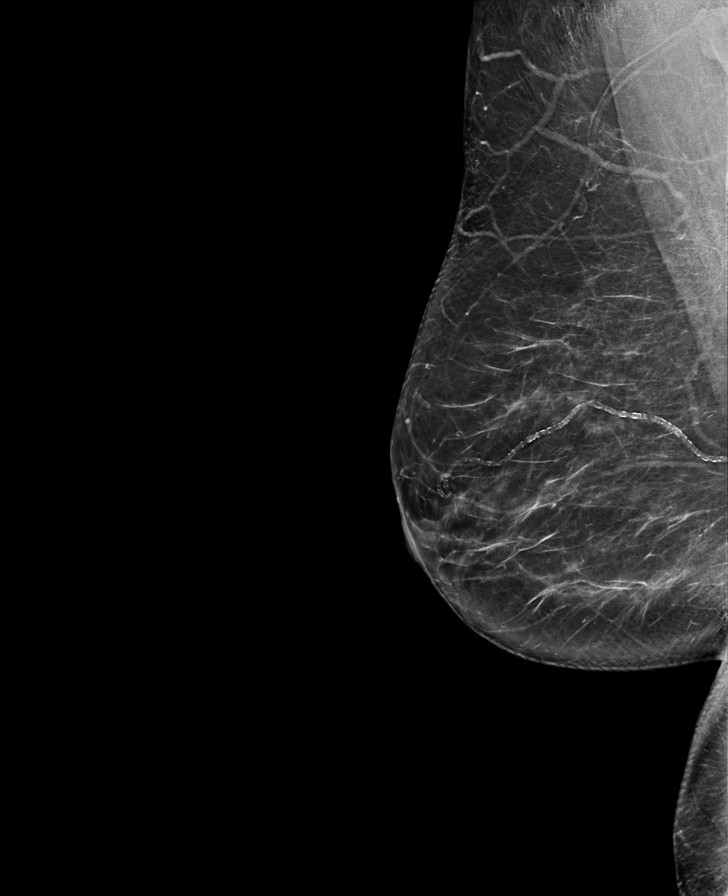

[R CC synth-2D]
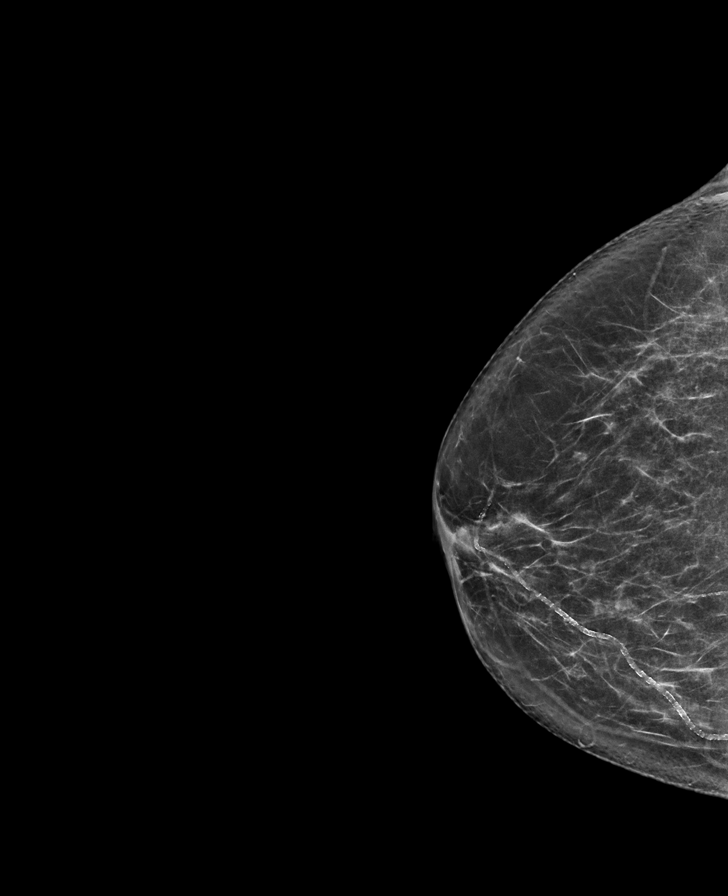

[L MLO synth-2D]
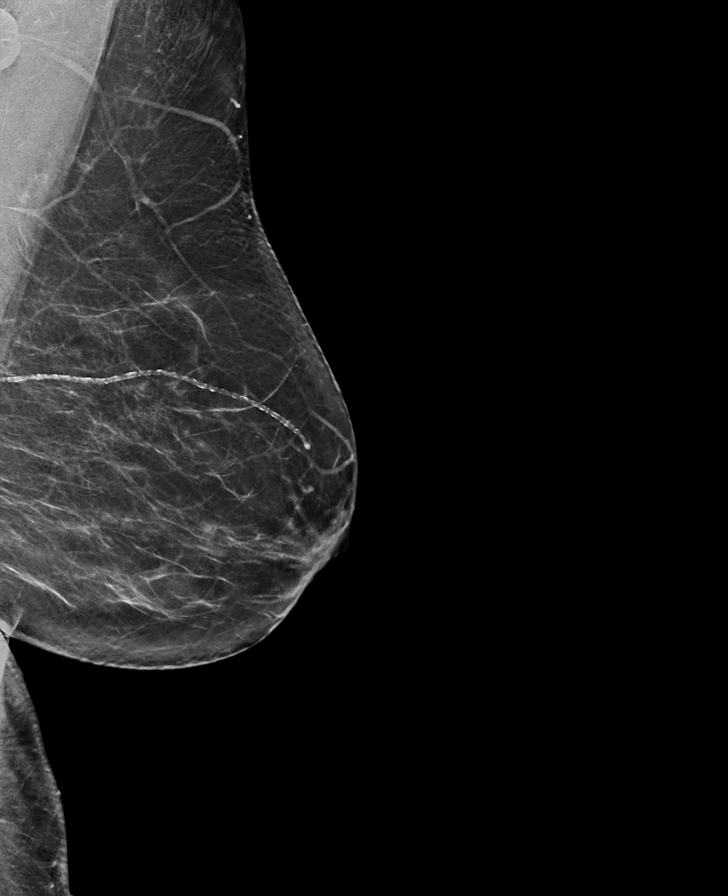

[L CC tomo · tomo slice 37/72.0]
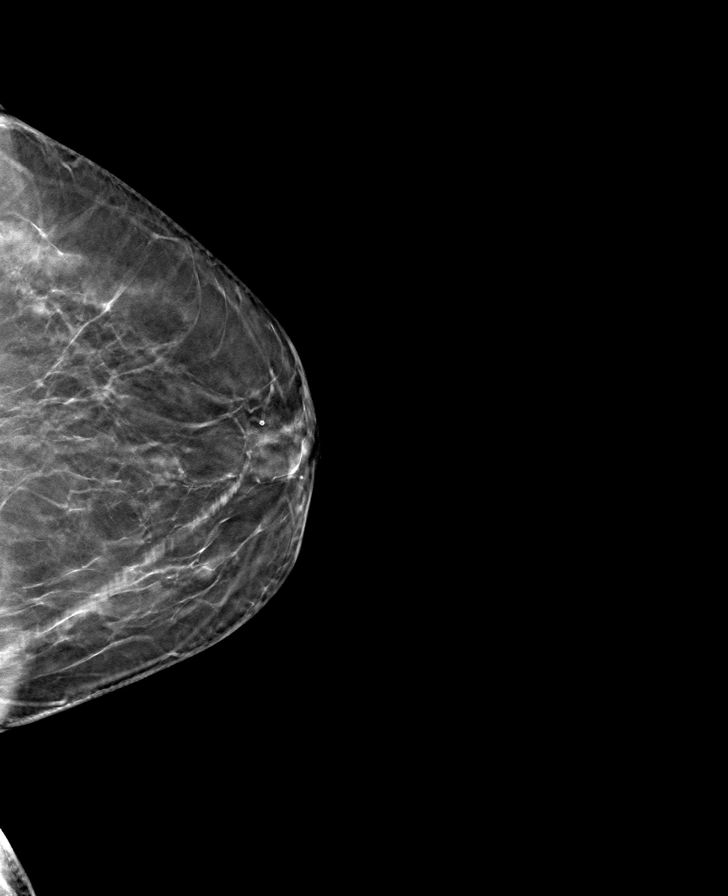

[L MLO tomo · tomo slice 39/77.0]
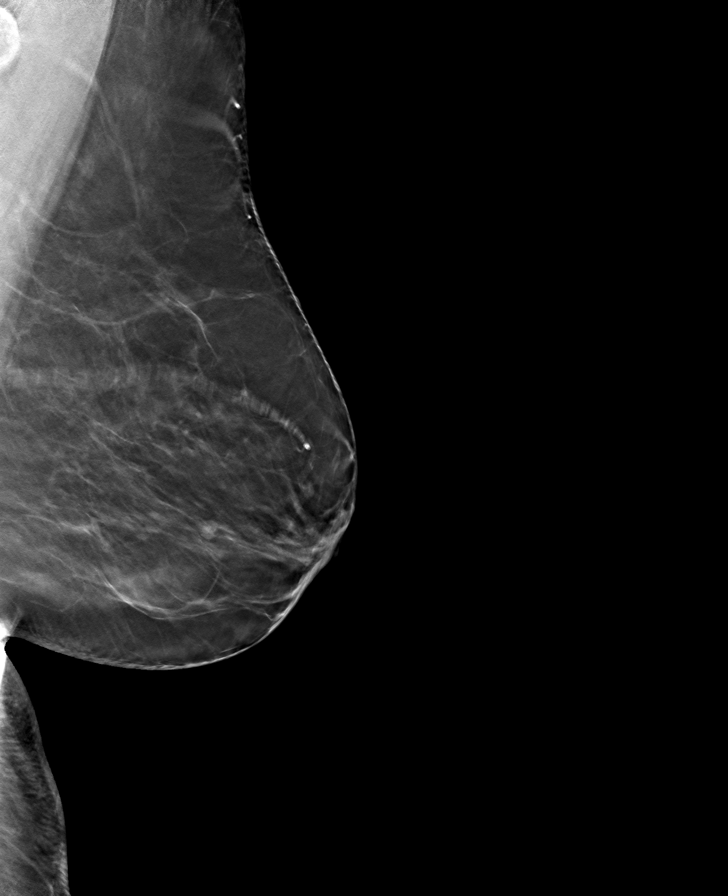

[R MLO tomo · tomo slice 39/78.0]
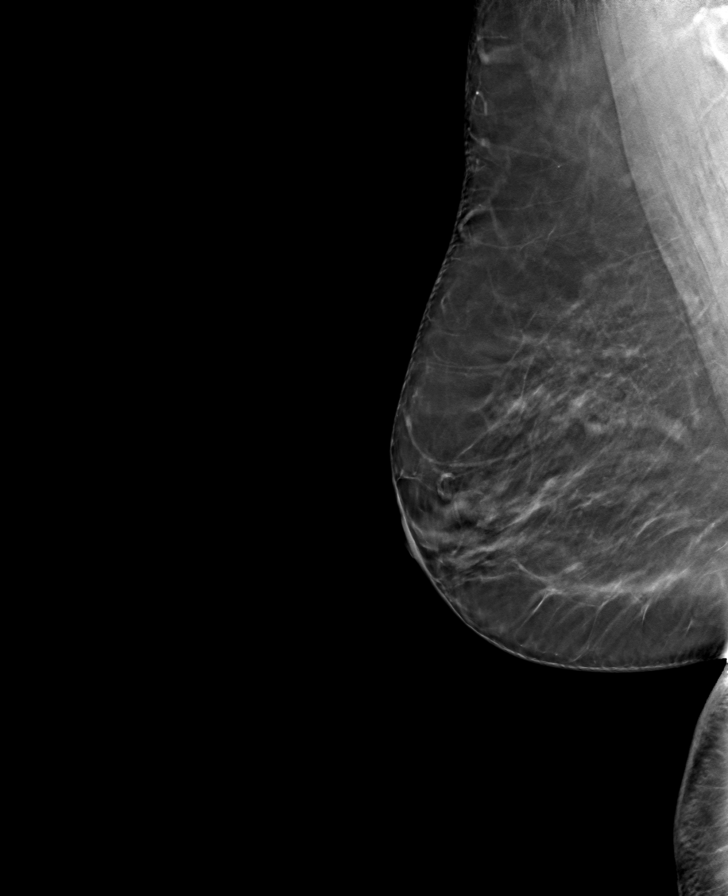

[R CC tomo · tomo slice 35/68.0]
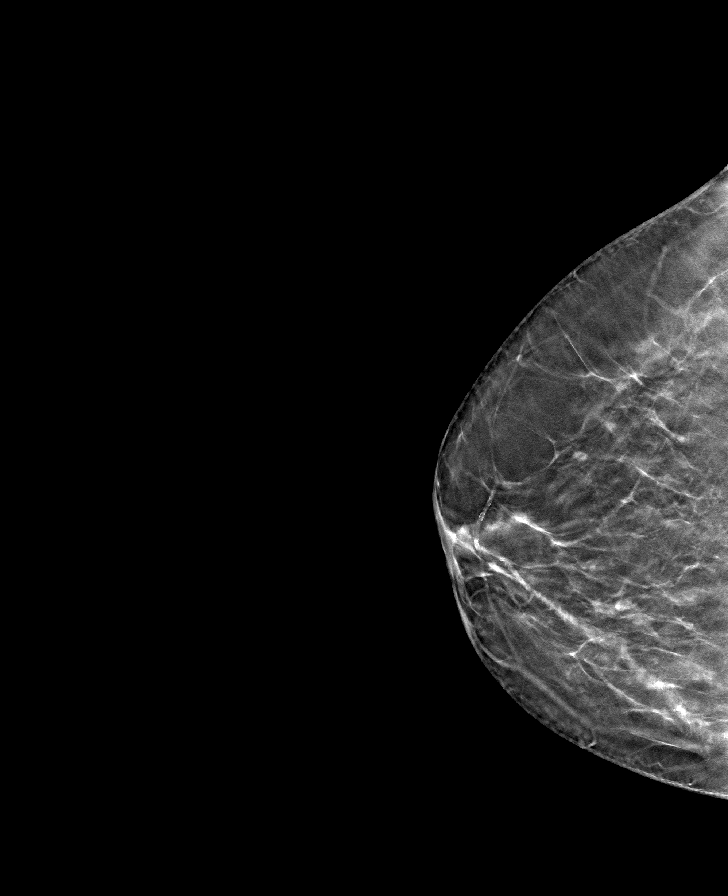

[8 of 24 positions shown; findings below may reference images not displayed]

ACR Breast Density Category b: There are scattered areas of
fibroglandular density.
FINDINGS: There are no findings suspicious for malignancy. The images were
evaluated with computer-aided detection.
IMPRESSION: No mammographic evidence of malignancy. A result letter of this
screening mammogram will be mailed directly to the patient.

RECOMMENDATION:
Screening mammogram in one year. (Code:WJ-[id number])

BI-RADS CATEGORY  1: Negative.

## 2021-12-26 NOTE — Discharge Summary (Signed)
Physician Discharge Summary  ?Patient ID: ?Colleen Conley ?MRN: [id number] ?DOB/AGE: 02/10/1951 71 y.o. ? ?Admit date: 12/17/2021 ?Discharge date: 12/26/2021 ? ?Admission Diagnoses: ? ?Discharge Diagnoses:  ?Principal Problem: ?  Vaginal vault prolapse after hysterectomy ? ? ?Discharged Condition: good ? ?Hospital Course: 71yo F admitted for robotic assisted lysis of adhesions, right salpingectomy, sacrocolpopexy and cystoscopy. Surgery course was uncomplicated and she was discharged on the same day after observation. She passed her voiding trial, pain was controlled and she was able to tolerated PO and ambulate on discharge.  ? ?Consults: None ? ?Significant Diagnostic Studies: none ? ?Treatments: surgery- see above ? ?Discharge Exam: ?Blood pressure 105/63, pulse 71, temperature 97.9 ?F (36.6 ?C), resp. rate 16, height 5\' 8"  (1.727 m), weight 78.7 kg, SpO2 95 %. ?General appearance: alert and cooperative ?Resp: clear to auscultation bilaterally ?GI: soft, non-tender; bowel sounds normal; no masses,  no organomegaly ? ?Disposition: Discharge disposition: 01-Home or Self Care ? ? ? ? ? ? ?Discharge Instructions   ? ? Call MD for:  persistant nausea and vomiting   Complete by: As directed ?  ? Call MD for:  redness, tenderness, or signs of infection (pain, swelling, redness, odor or green/yellow discharge around incision site)   Complete by: As directed ?  ? Call MD for:  severe uncontrolled pain   Complete by: As directed ?  ? Call MD for:  temperature >100.4   Complete by: As directed ?  ? Diet general   Complete by: As directed ?  ? Increase activity slowly   Complete by: As directed ?  ? May walk up steps   Complete by: As directed ?  ? ?  ? ?Allergies as of 12/17/2021   ?No Known Allergies ?  ? ?  ?Medication List  ?  ? ?TAKE these medications   ? ?acetaminophen 500 MG tablet ?Commonly known as: TYLENOL ?Take 1 tablet (500 mg total) by mouth every 6 (six) hours as needed (pain). ?  ?ibuprofen 600 MG tablet ?Commonly  known as: ADVIL ?Take 1 tablet (600 mg total) by mouth every 6 (six) hours as needed. ?  ?omeprazole 20 MG tablet ?Commonly known as: PRILOSEC OTC ?Take 20 mg by mouth daily as needed. ?  ?oxyCODONE 5 MG immediate release tablet ?Commonly known as: Oxy IR/ROXICODONE ?Take 1 tablet (5 mg total) by mouth every 4 (four) hours as needed for severe pain. ?  ?polyethylene glycol powder 17 GM/SCOOP powder ?Commonly known as: GLYCOLAX/MIRALAX ?Take 17 g by mouth daily. Drink 17g (1 scoop) dissolved in water per day. ?  ?sertraline 25 MG tablet ?Commonly known as: ZOLOFT ?Take 25 mg by mouth at bedtime. ?  ?simvastatin 20 MG tablet ?Commonly known as: ZOCOR ?Take 20 mg by mouth at bedtime. ?  ?thyroid 30 MG tablet ?Commonly known as: ARMOUR ?Take 90 mg by mouth daily before breakfast. ?  ? ?  ? ? ? ?Signed: ?12/19/2021 ?12/26/2021, 10:10 AM ? ? ?

## 2022-01-28 ENCOUNTER — Ambulatory Visit (INDEPENDENT_AMBULATORY_CARE_PROVIDER_SITE_OTHER): Payer: Medicare Other | Admitting: Obstetrics and Gynecology

## 2022-01-28 ENCOUNTER — Encounter: Payer: Self-pay | Admitting: Obstetrics and Gynecology

## 2022-01-28 VITALS — BP 122/80 | HR 69

## 2022-01-28 DIAGNOSIS — Z9889 Other specified postprocedural states: Secondary | ICD-10-CM

## 2022-01-28 NOTE — Progress Notes (Signed)
[facility] ? ?Date of Visit: 01/28/2022 ? ?History of Present Illness: [name] is a 71 y.o. female scheduled today for a post-operative visit.  ?? Surgery: s/p Robotic assisted lysis of adhesions, right salpingectomy, sacrocolpopexy, cystoscopy on 12/17/21 ?? She passed her postoperative void trial.  ?? Postoperative course has been uncomplicated.  ? ?Today she reports she is feeling well and has no complaints.  ? ?UTI in the last 6 weeks? No  ?Pain? No  ?Vaginal bulge? No  ?Stress incontinence: No  ?Urgency/frequency: No  ?Urge incontinence: No  ?Voiding dysfunction: No  ?Bowel issues: No  ? ?Subjective Success: Do you usually have a bulge or something falling out that you can see or feel in the vaginal area? No  ?Retreatment Success: Any retreatment with surgery or pessary for any compartment? No  ? ?Pathology results: FALLOPIAN TUBE, RIGHT, SALPINGECTOMY:  ?-  Benign fallopian tube  ?-  No malignancy identified  ? ?Medications: She has a current medication list which includes the following prescription(s): acetaminophen, ibuprofen, omeprazole, oxycodone, polyethylene glycol powder, sertraline, and simvastatin.  ? ?Allergies: Patient has No Known Allergies.  ? ?Physical Exam: ?BP 122/80   Pulse 69   ?Abdomen: soft, non-tender, without masses or organomegaly ?Laparoscopic Incisions: healing well.  ?Pelvic Examination: Vagina: Normal vaginal mucosa.  No tenderness along the anterior or posterior vagina. No apical tenderness. No pelvic masses. No visible or palpable mesh. ? ?POP-Q: ?POP-Q ? ?-2.5  ?                                          Aa   ?-2.5 ?                                          Ba  ?-9  ?                                            C  ? ?3.5  ?                                          Gh  ?3  ?                                          Pb  ?9  ?                                          tvl  ? ?-2  ?                                          Ap  ?-2  ?  Bp  ?   ?                                            D  ? ? ? ?--------------------------------------------------------- ? ?Assessment and Plan:  ?1. Post-operative state   ? ? ?- Can resume regular activity including exercise, swimming and intercourse,  if desired.  ?- Discussed avoidance of heavy lifting and straining long term to reduce the risk of recurrence.  ?- Follow up with GYN/ PCP annually ? ?Follow up as needed ? ?Colleen Beards, MD ? ? ?

## 2022-06-17 ENCOUNTER — Other Ambulatory Visit: Payer: Self-pay | Admitting: Family Medicine

## 2022-06-17 DIAGNOSIS — Z1231 Encounter for screening mammogram for malignant neoplasm of breast: Secondary | ICD-10-CM

## 2022-07-25 ENCOUNTER — Ambulatory Visit
Admission: RE | Admit: 2022-07-25 | Discharge: 2022-07-25 | Disposition: A | Discharge status: Home / Self Care | Payer: Medicare Other | Source: Ambulatory Visit | Attending: Family Medicine | Admitting: Family Medicine

## 2022-07-25 DIAGNOSIS — Z1231 Encounter for screening mammogram for malignant neoplasm of breast: Secondary | ICD-10-CM | POA: Diagnosis present

## 2022-07-28 ENCOUNTER — Encounter: Payer: Self-pay | Admitting: *Deleted

## 2022-10-06 DIAGNOSIS — D241 Benign neoplasm of right breast: Secondary | ICD-10-CM

## 2022-10-06 HISTORY — DX: Benign neoplasm of right breast: D24.1

## 2022-11-28 ENCOUNTER — Ambulatory Visit (INDEPENDENT_AMBULATORY_CARE_PROVIDER_SITE_OTHER): Payer: Medicare Other | Admitting: Podiatry

## 2022-11-28 DIAGNOSIS — B351 Tinea unguium: Secondary | ICD-10-CM | POA: Diagnosis not present

## 2022-11-28 DIAGNOSIS — Z79899 Other long term (current) drug therapy: Secondary | ICD-10-CM | POA: Diagnosis not present

## 2022-11-28 NOTE — Progress Notes (Signed)
Subjective:  Patient ID: Colleen Conley, female    DOB: 07/09/1951,  MRN: [id number]  Chief Complaint  Patient presents with   Nail Problem    Nail discomfort bilateral hallux     72 y.o. female presents with the above complaint.  Bilateral hallux thickened elongated dystrophic mycotic toenails x 2.  She states been present for quite some time she wanted to discuss treatment options for it she has not seen anyone else prior to seeing me.  She would like to discuss Lamisil therapy.   Review of Systems: Negative except as noted in the HPI. Denies N/V/F/Ch.  Past Medical History:  Diagnosis Date   GERD (gastroesophageal reflux disease)    History of adenomatous polyp of colon    Hyperlipidemia    stress echo in care everywhere 11-19-2018,  normal   Hypothyroidism    followed by endocrinologist-- dr Ronnald Collum  ([place])  pt stated just started seeing him, told has thyroiditis started new med. np thryoid   Mild obstructive sleep apnea    per pt told very mild osa ,  no cpap, in 2017   OA (osteoarthritis)    PONV (postoperative nausea and vomiting)    SUI (stress urinary incontinence, female)    Vaginal prolapse    vaginal vault and posterior wall   Wears glasses     Current Outpatient Medications:    acetaminophen (TYLENOL) 500 MG tablet, Take 1 tablet (500 mg total) by mouth every 6 (six) hours as needed (pain)., Disp: 30 tablet, Rfl: 0   ibuprofen (ADVIL) 600 MG tablet, Take 1 tablet (600 mg total) by mouth every 6 (six) hours as needed., Disp: 30 tablet, Rfl: 0   omeprazole (PRILOSEC OTC) 20 MG tablet, Take 20 mg by mouth daily as needed., Disp: , Rfl:    oxyCODONE (OXY IR/ROXICODONE) 5 MG immediate release tablet, Take 1 tablet (5 mg total) by mouth every 4 (four) hours as needed for severe pain., Disp: 15 tablet, Rfl: 0   polyethylene glycol powder (GLYCOLAX/MIRALAX) 17 GM/SCOOP powder, Take 17 g by mouth daily. Drink 17g (1 scoop) dissolved in water per day., Disp: 255 g, Rfl:  0   sertraline (ZOLOFT) 25 MG tablet, Take 25 mg by mouth at bedtime., Disp: , Rfl:    simvastatin (ZOCOR) 20 MG tablet, Take 20 mg by mouth at bedtime., Disp: , Rfl:   Social History   Tobacco Use  Smoking Status Former   Years: 20.00   Types: Cigarettes   Quit date: 1985   Years since quitting: 39.1  Smokeless Tobacco Never    No Known Allergies Objective:  There were no vitals filed for this visit. There is no height or weight on file to calculate BMI. Constitutional Well developed. Well nourished.  Vascular Dorsalis pedis pulses palpable bilaterally. Posterior tibial pulses palpable bilaterally. Capillary refill normal to all digits.  No cyanosis or clubbing noted. Pedal hair growth normal.  Neurologic Normal speech. Oriented to person, place, and time. Epicritic sensation to light touch grossly present bilaterally.  Dermatologic Nails thickened and again dystrophic mycotic toenails x 2 mild pain on palpation Skin within normal limits  Orthopedic: Normal joint ROM without pain or crepitus bilaterally. No visible deformities. No bony tenderness.   Radiographs: None Assessment:   1. Long-term use of high-risk medication   2. Nail fungus   3. Onychomycosis due to dermatophyte    Plan:  Patient was evaluated and treated and all questions answered.  Bilateral hallux and fifth digit onychomycosis -Educated  the patient on the etiology of onychomycosis and various treatment options associated with improving the fungal load.  I explained to the patient that there is 3 treatment options available to treat the onychomycosis including topical, p.o., laser treatment.  Patient elected to undergo p.o. options with Lamisil/terbinafine therapy.  In order for me to start the medication therapy, I explained to the patient the importance of evaluating the liver and obtaining the liver function test.  Once the liver function test comes back normal I will start him on 40-monthcourse of  Lamisil therapy.  Patient understood all risk and would like to proceed with Lamisil therapy.  I have asked the patient to immediately stop the Lamisil therapy if she has any reactions to it and call the office or go to the emergency room right away.  Patient states understanding   No follow-ups on file.

## 2022-11-29 LAB — HEPATIC FUNCTION PANEL
ALT: 15 IU/L (ref 0–32)
AST: 16 IU/L (ref 0–40)
Albumin: 4 g/dL (ref 3.9–4.9)
Alkaline Phosphatase: 136 IU/L — ABNORMAL HIGH (ref 44–121)
Bilirubin Total: 0.4 mg/dL (ref 0.0–1.2)
Bilirubin, Direct: 0.11 mg/dL (ref 0.00–0.40)
Total Protein: 7.3 g/dL (ref 6.0–8.5)

## 2022-12-01 MED ORDER — TERBINAFINE HCL 250 MG PO TABS: 250.0000 mg | ORAL_TABLET | Freq: Every day | ORAL | 0 refills | Status: DC

## 2022-12-01 NOTE — Addendum Note (Signed)
Addended by: Boneta Lucks on: 12/01/2022 08:25 AM   Modules accepted: Orders

## 2023-03-31 ENCOUNTER — Encounter: Payer: Medicare Other | Admitting: Podiatry

## 2023-04-07 ENCOUNTER — Ambulatory Visit (INDEPENDENT_AMBULATORY_CARE_PROVIDER_SITE_OTHER): Payer: Medicare Other | Admitting: Podiatry

## 2023-04-07 DIAGNOSIS — L600 Ingrowing nail: Secondary | ICD-10-CM

## 2023-04-07 NOTE — Progress Notes (Signed)
Subjective:  Patient ID: Colleen Conley, female    DOB: 1951-04-07,  MRN: [id number]  Chief Complaint  Patient presents with   Nail Problem    72 y.o. female presents with the above complaint.  Patient was in with bilateral thickened elongated dystrophic mycotic nails x 2.  She would like to have it removed and made permanent.  She has not seen anyone as prior to seeing me denies any other acute complaints.  Would like to discuss treatment options.   Review of Systems: Negative except as noted in the HPI. Denies N/V/F/Ch.  Past Medical History:  Diagnosis Date   GERD (gastroesophageal reflux disease)    History of adenomatous polyp of colon    Hyperlipidemia    stress echo in care everywhere 11-19-2018,  normal   Hypothyroidism    followed by endocrinologist-- dr Patrecia Pace  ([place])  pt stated just started seeing him, told has thyroiditis started new med. np thryoid   Mild obstructive sleep apnea    per pt told very mild osa ,  no cpap, in 2017   OA (osteoarthritis)    PONV (postoperative nausea and vomiting)    SUI (stress urinary incontinence, female)    Vaginal prolapse    vaginal vault and posterior wall   Wears glasses     Current Outpatient Medications:    acetaminophen (TYLENOL) 500 MG tablet, Take 1 tablet (500 mg total) by mouth every 6 (six) hours as needed (pain)., Disp: 30 tablet, Rfl: 0   ibuprofen (ADVIL) 600 MG tablet, Take 1 tablet (600 mg total) by mouth every 6 (six) hours as needed., Disp: 30 tablet, Rfl: 0   omeprazole (PRILOSEC OTC) 20 MG tablet, Take 20 mg by mouth daily as needed., Disp: , Rfl:    oxyCODONE (OXY IR/ROXICODONE) 5 MG immediate release tablet, Take 1 tablet (5 mg total) by mouth every 4 (four) hours as needed for severe pain., Disp: 15 tablet, Rfl: 0   polyethylene glycol powder (GLYCOLAX/MIRALAX) 17 GM/SCOOP powder, Take 17 g by mouth daily. Drink 17g (1 scoop) dissolved in water per day., Disp: 255 g, Rfl: 0   sertraline (ZOLOFT) 25 MG  tablet, Take 25 mg by mouth at bedtime., Disp: , Rfl:    simvastatin (ZOCOR) 20 MG tablet, Take 20 mg by mouth at bedtime., Disp: , Rfl:    terbinafine (LAMISIL) 250 MG tablet, Take 1 tablet (250 mg total) by mouth daily., Disp: 90 tablet, Rfl: 0  Social History   Tobacco Use  Smoking Status Former   Current packs/day: 0.00   Types: Cigarettes   Start date: 39   Quit date: 1985   Years since quitting: 39.5  Smokeless Tobacco Never    No Known Allergies Objective:  There were no vitals filed for this visit. There is no height or weight on file to calculate BMI. Constitutional Well developed. Well nourished.  Vascular Dorsalis pedis pulses palpable bilaterally. Posterior tibial pulses palpable bilaterally. Capillary refill normal to all digits.  No cyanosis or clubbing noted. Pedal hair growth normal.  Neurologic Normal speech. Oriented to person, place, and time. Epicritic sensation to light touch grossly present bilaterally.  Dermatologic Pain on palpation of the entire/total nail on 1st digit of the bilaterally No other open wounds. No skin lesions.  Orthopedic: Normal joint ROM without pain or crepitus bilaterally. No visible deformities. No bony tenderness.   Radiographs: None Assessment:   1. Ingrown toenail of right foot   2. Ingrown left big toenail  Plan:  Patient was evaluated and treated and all questions answered.  Nail contusion/dystrophy hallux with ingrown, bilaterally -Patient elects to proceed with minor surgery to remove entire toenail today. Consent reviewed and signed by patient. -Entire/total nail excised. See procedure note. -Educated on post-procedure care including soaking. Written instructions provided and reviewed. -Patient to follow up in 2 weeks for nail check.  Procedure: Excision of entire/total nail with phenol matricectomy Location: Bilateral 1st toe digit Anesthesia: Lidocaine 1% plain; 1.5 mL and Marcaine 0.5% plain; 1.5 mL,  digital block. Skin Prep: Betadine. Dressing: Silvadene; telfa; dry, sterile, compression dressing. Technique: Following skin prep, the toe was exsanguinated and a tourniquet was secured at the base of the toe. The affected nail border was freed and excised.  Phenol matricectomy was performed in standard technique the tourniquet was then removed and sterile dressing applied. Disposition: Patient tolerated procedure well. Patient to return in 2 weeks for follow-up.   No follow-ups on file.

## 2023-07-10 ENCOUNTER — Encounter: Payer: Self-pay | Admitting: Family Medicine

## 2023-07-10 DIAGNOSIS — Z1231 Encounter for screening mammogram for malignant neoplasm of breast: Secondary | ICD-10-CM

## 2023-07-14 ENCOUNTER — Other Ambulatory Visit: Payer: Self-pay | Admitting: Family Medicine

## 2023-07-14 DIAGNOSIS — L988 Other specified disorders of the skin and subcutaneous tissue: Secondary | ICD-10-CM

## 2023-07-24 ENCOUNTER — Ambulatory Visit
Admission: RE | Admit: 2023-07-24 | Discharge: 2023-07-24 | Disposition: A | Discharge status: Home / Self Care | Payer: Medicare Other | Source: Ambulatory Visit | Attending: Family Medicine | Admitting: Family Medicine

## 2023-07-24 DIAGNOSIS — N6452 Nipple discharge: Secondary | ICD-10-CM | POA: Insufficient documentation

## 2023-07-24 DIAGNOSIS — N6313 Unspecified lump in the right breast, lower outer quadrant: Secondary | ICD-10-CM | POA: Insufficient documentation

## 2023-07-24 DIAGNOSIS — L988 Other specified disorders of the skin and subcutaneous tissue: Secondary | ICD-10-CM | POA: Insufficient documentation

## 2023-07-31 ENCOUNTER — Other Ambulatory Visit: Payer: Self-pay | Admitting: Family Medicine

## 2023-07-31 DIAGNOSIS — N6310 Unspecified lump in the right breast, unspecified quadrant: Secondary | ICD-10-CM

## 2023-07-31 DIAGNOSIS — N63 Unspecified lump in unspecified breast: Secondary | ICD-10-CM

## 2023-07-31 DIAGNOSIS — R928 Other abnormal and inconclusive findings on diagnostic imaging of breast: Secondary | ICD-10-CM

## 2023-08-04 ENCOUNTER — Other Ambulatory Visit: Payer: Self-pay | Admitting: Family Medicine

## 2023-08-04 DIAGNOSIS — R928 Other abnormal and inconclusive findings on diagnostic imaging of breast: Secondary | ICD-10-CM

## 2023-08-04 DIAGNOSIS — N63 Unspecified lump in unspecified breast: Secondary | ICD-10-CM

## 2023-08-04 DIAGNOSIS — N6310 Unspecified lump in the right breast, unspecified quadrant: Secondary | ICD-10-CM

## 2023-08-17 ENCOUNTER — Ambulatory Visit
Admission: RE | Admit: 2023-08-17 | Discharge: 2023-08-17 | Disposition: A | Discharge status: Home / Self Care | Payer: Medicare Other | Source: Ambulatory Visit | Attending: Family Medicine | Admitting: Family Medicine

## 2023-08-17 ENCOUNTER — Other Ambulatory Visit: Payer: Self-pay | Admitting: Family Medicine

## 2023-08-17 DIAGNOSIS — N63 Unspecified lump in unspecified breast: Secondary | ICD-10-CM

## 2023-08-17 DIAGNOSIS — N62 Hypertrophy of breast: Secondary | ICD-10-CM | POA: Diagnosis present

## 2023-08-17 DIAGNOSIS — R928 Other abnormal and inconclusive findings on diagnostic imaging of breast: Secondary | ICD-10-CM

## 2023-08-17 DIAGNOSIS — D241 Benign neoplasm of right breast: Secondary | ICD-10-CM | POA: Insufficient documentation

## 2023-08-17 HISTORY — PX: BREAST BIOPSY: SHX20

## 2023-08-17 MED ORDER — LIDOCAINE 1 % OPTIME INJ - NO CHARGE
2.0000 mL | Freq: Once | INTRAMUSCULAR | Status: AC
  Administered 2023-08-17: 2 mL
  Filled 2023-08-17: qty 2

## 2023-08-17 MED ORDER — LIDOCAINE-EPINEPHRINE 1 %-1:100000 IJ SOLN
5.0000 mL | Freq: Once | INTRAMUSCULAR | Status: AC
  Administered 2023-08-17: 5 mL
  Filled 2023-08-17: qty 5

## 2023-08-18 ENCOUNTER — Encounter: Payer: Self-pay | Admitting: *Deleted

## 2023-08-18 LAB — SURGICAL PATHOLOGY

## 2023-08-18 NOTE — Progress Notes (Signed)
Referral recieved from [facility] for benign breast mass.  She will see Dr. Tonna Boehringer tomorrow at 3:15.  Appt. Details given to her.

## 2023-08-19 ENCOUNTER — Ambulatory Visit: Payer: Self-pay | Admitting: Surgery

## 2023-08-20 ENCOUNTER — Other Ambulatory Visit: Payer: Self-pay | Admitting: Surgery

## 2023-08-20 DIAGNOSIS — D241 Benign neoplasm of right breast: Secondary | ICD-10-CM

## 2023-08-25 ENCOUNTER — Other Ambulatory Visit: Payer: Self-pay

## 2023-08-25 ENCOUNTER — Encounter
Admission: RE | Admit: 2023-08-25 | Discharge: 2023-08-25 | Disposition: A | Discharge status: Home / Self Care | Payer: Medicare Other | Source: Ambulatory Visit | Attending: Surgery | Admitting: Surgery

## 2023-08-25 VITALS — BP 130/73 | HR 75 | Resp 16 | Wt 178.6 lb

## 2023-08-25 DIAGNOSIS — R079 Chest pain, unspecified: Secondary | ICD-10-CM | POA: Diagnosis not present

## 2023-08-25 DIAGNOSIS — Z01810 Encounter for preprocedural cardiovascular examination: Secondary | ICD-10-CM | POA: Diagnosis not present

## 2023-08-25 DIAGNOSIS — Z01812 Encounter for preprocedural laboratory examination: Secondary | ICD-10-CM

## 2023-08-25 DIAGNOSIS — Z01818 Encounter for other preprocedural examination: Secondary | ICD-10-CM | POA: Diagnosis present

## 2023-08-25 HISTORY — DX: Depression, unspecified: F32.A

## 2023-08-25 HISTORY — DX: Anxiety disorder, unspecified: F41.9

## 2023-08-25 LAB — BASIC METABOLIC PANEL
Anion gap: 8 (ref 5–15)
BUN: 14 mg/dL (ref 8–23)
CO2: 23 mmol/L (ref 22–32)
Calcium: 8.7 mg/dL — ABNORMAL LOW (ref 8.9–10.3)
Chloride: 110 mmol/L (ref 98–111)
Creatinine, Ser: 0.76 mg/dL (ref 0.44–1.00)
GFR, Estimated: >60 mL/min (ref >60)
Glucose, Bld: 113 mg/dL — ABNORMAL HIGH (ref 70–99)
Potassium: 3.9 mmol/L (ref 3.5–5.1)
Sodium: 141 mmol/L (ref 135–145)

## 2023-08-25 LAB — CBC
HCT: 37.8 % (ref 36.0–46.0)
Hemoglobin: 12.7 g/dL (ref 12.0–15.0)
MCH: 30.8 pg (ref 26.0–34.0)
MCHC: 33.6 g/dL (ref 30.0–36.0)
MCV: 91.5 fL (ref 80.0–100.0)
Platelets: 272 10*3/uL (ref 150–400)
RBC: 4.13 MIL/uL (ref 3.87–5.11)
RDW: 13.2 % (ref 11.5–15.5)
WBC: 6.4 10*3/uL (ref 4.0–10.5)
nRBC: 0 % (ref 0.0–0.2)

## 2023-08-25 NOTE — Patient Instructions (Addendum)
Your procedure is scheduled on:08/28/23 - Friday Report to the Registration Desk on the 1st floor of the [facility]. To find out your arrival time, please call [phone] between 1PM - 3PM on: 08/27/23 - Thursday If your arrival time is 6:00 am, do not arrive before that time as the [facility] entrance doors do not open until 6:00 am.  REMEMBER: Instructions that are not followed completely may result in serious medical risk, up to and including death; or upon the discretion of your surgeon and anesthesiologist your surgery may need to be rescheduled.  Do not eat food after midnight the night before surgery.  No gum chewing or hard candies.  You may however, drink CLEAR liquids up to 2 hours before you are scheduled to arrive for your surgery. Do not drink anything within 2 hours of your scheduled arrival time.  Clear liquids include: - water  - apple juice without pulp - gatorade (not RED colors) - black coffee or tea (Do NOT add milk or creamers to the coffee or tea) Do NOT drink anything that is not on this list.  One week prior to surgery: Stop Anti-inflammatories (NSAIDS) such as Advil, Aleve, Ibuprofen, Motrin, Naproxen, Naprosyn and Aspirin based products such as Excedrin, Goody's Powder, BC Powder. You may take Tylenol if needed for pain up until the day of surgery.   Stop ANY OVER THE COUNTER supplements until after surgery.  ON THE DAY OF SURGERY ONLY TAKE THESE MEDICATIONS WITH SIPS OF WATER:  NP THYROID  omeprazole (PRILOSEC OTC)  sertraline (ZOLOFT)    No Alcohol for 24 hours before or after surgery.  No Smoking including e-cigarettes for 24 hours before surgery.  No chewable tobacco products for at least 6 hours before surgery.  No nicotine patches on the day of surgery.  Do not use any "recreational" drugs for at least a week (preferably 2 weeks) before your surgery.  Please be advised that the combination of cocaine and anesthesia may have negative  outcomes, up to and including death. If you test positive for cocaine, your surgery will be cancelled.  On the morning of surgery brush your teeth with toothpaste and water, you may rinse your mouth with mouthwash if you wish. Do not swallow any toothpaste or mouthwash.  Use CHG Soap or wipes as directed on instruction sheet.  Do not wear jewelry, make-up, hairpins, clips or nail polish.  For welded (permanent) jewelry: bracelets, anklets, waist bands, etc.  Please have this removed prior to surgery.  If it is not removed, there is a chance that hospital personnel will need to cut it off on the day of surgery.  Do not wear lotions, powders, or perfumes.   Do not shave body hair from the neck down 48 hours before surgery.  Contact lenses, hearing aids and dentures may not be worn into surgery.  Do not bring valuables to the hospital. [facility] is not responsible for any missing/lost belongings or valuables.   Notify your doctor if there is any change in your medical condition (cold, fever, infection).  Wear comfortable clothing (specific to your surgery type) to the hospital.  After surgery, you can help prevent lung complications by doing breathing exercises.  Take deep breaths and cough every 1-2 hours. Your doctor may order a device called an Incentive Spirometer to help you take deep breaths. When coughing or sneezing, hold a pillow firmly against your incision with both hands. This is called "splinting." Doing this helps protect your  incision. It also decreases belly discomfort.  If you are being admitted to the hospital overnight, leave your suitcase in the car. After surgery it may be brought to your room.  In case of increased patient census, it may be necessary for you, the patient, to continue your postoperative care in the Same Day Surgery department.  If you are being discharged the day of surgery, you will not be allowed to drive home. You will need a responsible  individual to drive you home and stay with you for 24 hours after surgery.   If you are taking public transportation, you will need to have a responsible individual with you.  Please call the Pre-admissions Testing Dept. at [phone] if you have any questions about these instructions.  Surgery Visitation Policy:  Patients having surgery or a procedure may have two visitors.  Children under the age of 56 must have an adult with them who is not the patient.  Inpatient Visitation:    Visiting hours are 7 a.m. to 8 p.m. Up to four visitors are allowed at one time in a patient room. The visitors may rotate out with other people during the day.  One visitor age 72 or older may stay with the patient overnight and must be in the room by 8 p.m.  Preparing the Skin Before Surgery     To help prevent the risk of infection at your surgical site, we are now providing you with rinse-free Sage 2% Chlorhexidine Gluconate (CHG) disposable wipes.  Chlorhexidine Gluconate (CHG) Soap  o An antiseptic cleaner that kills germs and bonds with the skin to continue killing germs even after washing  o Used for showering the night before surgery and morning of surgery  The night before surgery: Shower or bathe with warm water. Do not apply perfume, lotions, powders. Wait one hour after shower. Skin should be dry and cool. Open Sage wipe package - use 6 disposable cloths. Wipe body using one cloth for the right arm, one cloth for the left arm, one cloth for the right leg, one cloth for the left leg, one cloth for the chest/abdomen area, and one cloth for the back. Do not use on open wounds or sores. Do not use on face or genitals (private parts). If you are breast feeding, do not use on breasts. 5. Do not rinse, allow to dry. 6. Skin may feel "tacky" for several minutes. 7. Dress in clean clothes. 8. Place clean sheets on your bed and do not sleep with pets.  REPEAT ABOVE ON THE MORNING OF  SURGERY BEFORE ARRIVING TO THE HOSPITAL.

## 2023-08-26 ENCOUNTER — Ambulatory Visit
Admission: RE | Admit: 2023-08-26 | Discharge: 2023-08-26 | Disposition: A | Discharge status: Home / Self Care | Payer: Medicare Other | Source: Ambulatory Visit | Attending: Surgery | Admitting: Surgery

## 2023-08-26 DIAGNOSIS — D241 Benign neoplasm of right breast: Secondary | ICD-10-CM | POA: Diagnosis present

## 2023-08-26 HISTORY — PX: BREAST BIOPSY: SHX20

## 2023-08-26 MED ORDER — LIDOCAINE HCL 1 % IJ SOLN
15.0000 mL | Freq: Once | INTRAMUSCULAR | Status: AC
  Administered 2023-08-26: 15 mL
  Filled 2023-08-26: qty 15

## 2023-08-27 MED ORDER — ORAL CARE MOUTH RINSE: 15.0000 mL | Freq: Once | OROMUCOSAL | Status: AC

## 2023-08-27 MED ORDER — CHLORHEXIDINE GLUCONATE 0.12 % MT SOLN
15.0000 mL | Freq: Once | OROMUCOSAL | Status: AC
  Administered 2023-08-28: 15 mL via OROMUCOSAL

## 2023-08-27 MED ORDER — CEFAZOLIN SODIUM-DEXTROSE 2-4 GM/100ML-% IV SOLN
2.0000 g | INTRAVENOUS | Status: AC
  Administered 2023-08-28: 2 g via INTRAVENOUS

## 2023-08-27 MED ORDER — LACTATED RINGERS IV SOLN: INTRAVENOUS | Status: DC

## 2023-08-27 MED ORDER — CHLORHEXIDINE GLUCONATE CLOTH 2 % EX PADS
6.0000 | MEDICATED_PAD | Freq: Once | CUTANEOUS | Status: AC
  Administered 2023-08-28: 6 via TOPICAL

## 2023-08-28 ENCOUNTER — Encounter: Payer: Self-pay | Admitting: Student

## 2023-08-28 ENCOUNTER — Ambulatory Visit
Admission: RE | Admit: 2023-08-28 | Discharge: 2023-08-28 | Disposition: A | Discharge status: Home / Self Care | Payer: Medicare Other | Source: Ambulatory Visit | Attending: Surgery | Admitting: Surgery

## 2023-08-28 ENCOUNTER — Other Ambulatory Visit: Payer: Self-pay

## 2023-08-28 ENCOUNTER — Encounter: Payer: Self-pay | Admitting: Surgery

## 2023-08-28 ENCOUNTER — Encounter
Admission: RE | Disposition: A | Discharge status: Home / Self Care | Payer: Self-pay | Source: Ambulatory Visit | Attending: Surgery

## 2023-08-28 ENCOUNTER — Ambulatory Visit: Payer: Medicare Other | Admitting: Anesthesiology

## 2023-08-28 ENCOUNTER — Ambulatory Visit: Payer: Medicare Other | Admitting: Urgent Care

## 2023-08-28 ENCOUNTER — Other Ambulatory Visit: Payer: Self-pay | Admitting: Surgery

## 2023-08-28 DIAGNOSIS — E039 Hypothyroidism, unspecified: Secondary | ICD-10-CM | POA: Insufficient documentation

## 2023-08-28 DIAGNOSIS — N6021 Fibroadenosis of right breast: Secondary | ICD-10-CM | POA: Insufficient documentation

## 2023-08-28 DIAGNOSIS — F32A Depression, unspecified: Secondary | ICD-10-CM | POA: Insufficient documentation

## 2023-08-28 DIAGNOSIS — D241 Benign neoplasm of right breast: Secondary | ICD-10-CM | POA: Diagnosis present

## 2023-08-28 DIAGNOSIS — F419 Anxiety disorder, unspecified: Secondary | ICD-10-CM | POA: Insufficient documentation

## 2023-08-28 DIAGNOSIS — N6081 Other benign mammary dysplasias of right breast: Secondary | ICD-10-CM | POA: Insufficient documentation

## 2023-08-28 DIAGNOSIS — K219 Gastro-esophageal reflux disease without esophagitis: Secondary | ICD-10-CM | POA: Diagnosis not present

## 2023-08-28 DIAGNOSIS — Z1231 Encounter for screening mammogram for malignant neoplasm of breast: Secondary | ICD-10-CM

## 2023-08-28 DIAGNOSIS — Z87891 Personal history of nicotine dependence: Secondary | ICD-10-CM | POA: Insufficient documentation

## 2023-08-28 HISTORY — PX: BREAST LUMPECTOMY WITH RADIO FREQUENCY LOCALIZER: SHX6897

## 2023-08-28 SURGERY — BREAST LUMPECTOMY WITH RADIO FREQUENCY LOCALIZER
Anesthesia: General | Site: Breast | Laterality: Right

## 2023-08-28 MED ORDER — FENTANYL CITRATE (PF) 100 MCG/2ML IJ SOLN
INTRAMUSCULAR | Status: DC | PRN
  Administered 2023-08-28 (×2): 50 ug via INTRAVENOUS

## 2023-08-28 MED ORDER — IBUPROFEN 800 MG PO TABS: 800.0000 mg | ORAL_TABLET | Freq: Three times a day (TID) | ORAL | 0 refills | Status: AC | PRN

## 2023-08-28 MED ORDER — FENTANYL CITRATE (PF) 100 MCG/2ML IJ SOLN: 25.0000 ug | INTRAMUSCULAR | Status: DC | PRN

## 2023-08-28 MED ORDER — LIDOCAINE HCL (CARDIAC) PF 100 MG/5ML IV SOSY
PREFILLED_SYRINGE | INTRAVENOUS | Status: DC | PRN
  Administered 2023-08-28: 80 mg via INTRAVENOUS

## 2023-08-28 MED ORDER — ONDANSETRON HCL 4 MG/2ML IJ SOLN
INTRAMUSCULAR | Status: AC
Start: 2023-08-28 — End: ?
  Filled 2023-08-28: qty 2

## 2023-08-28 MED ORDER — SCOPOLAMINE 1 MG/3DAYS TD PT72
MEDICATED_PATCH | TRANSDERMAL | Status: AC
  Filled 2023-08-28: qty 1

## 2023-08-28 MED ORDER — FENTANYL CITRATE (PF) 100 MCG/2ML IJ SOLN
INTRAMUSCULAR | Status: AC
  Filled 2023-08-28: qty 2

## 2023-08-28 MED ORDER — DEXAMETHASONE SODIUM PHOSPHATE 10 MG/ML IJ SOLN
INTRAMUSCULAR | Status: AC
  Filled 2023-08-28: qty 1

## 2023-08-28 MED ORDER — MIDAZOLAM HCL 2 MG/2ML IJ SOLN
INTRAMUSCULAR | Status: DC | PRN
Start: 2023-08-28 — End: 2023-08-28
  Administered 2023-08-28: 2 mg via INTRAVENOUS

## 2023-08-28 MED ORDER — BUPIVACAINE HCL (PF) 0.5 % IJ SOLN
INTRAMUSCULAR | Status: AC
  Filled 2023-08-28: qty 30

## 2023-08-28 MED ORDER — LIDOCAINE HCL (PF) 2 % IJ SOLN
INTRAMUSCULAR | Status: AC
  Filled 2023-08-28: qty 5

## 2023-08-28 MED ORDER — LACTATED RINGERS IV SOLN: INTRAVENOUS | Status: DC

## 2023-08-28 MED ORDER — PROPOFOL 10 MG/ML IV BOLUS
INTRAVENOUS | Status: DC | PRN
  Administered 2023-08-28: 150 mg via INTRAVENOUS
  Administered 2023-08-28: 130 ug/kg/min via INTRAVENOUS

## 2023-08-28 MED ORDER — MIDAZOLAM HCL 2 MG/2ML IJ SOLN
INTRAMUSCULAR | Status: AC
  Filled 2023-08-28: qty 2

## 2023-08-28 MED ORDER — ACETAMINOPHEN 325 MG PO TABS: 650.0000 mg | ORAL_TABLET | Freq: Three times a day (TID) | ORAL | 0 refills | Status: AC | PRN

## 2023-08-28 MED ORDER — DEXAMETHASONE SODIUM PHOSPHATE 10 MG/ML IJ SOLN
INTRAMUSCULAR | Status: DC | PRN
  Administered 2023-08-28: 10 mg via INTRAVENOUS

## 2023-08-28 MED ORDER — KETOROLAC TROMETHAMINE 30 MG/ML IJ SOLN
INTRAMUSCULAR | Status: DC | PRN
  Administered 2023-08-28: 30 mg via INTRAVENOUS

## 2023-08-28 MED ORDER — KETAMINE HCL 50 MG/5ML IJ SOSY
PREFILLED_SYRINGE | INTRAMUSCULAR | Status: AC
  Filled 2023-08-28: qty 5

## 2023-08-28 MED ORDER — ACETAMINOPHEN 10 MG/ML IV SOLN
INTRAVENOUS | Status: AC
Start: 2023-08-28 — End: ?
  Filled 2023-08-28: qty 100

## 2023-08-28 MED ORDER — DOCUSATE SODIUM 100 MG PO CAPS: 100.0000 mg | ORAL_CAPSULE | Freq: Two times a day (BID) | ORAL | 0 refills | Status: AC | PRN

## 2023-08-28 MED ORDER — CEFAZOLIN SODIUM-DEXTROSE 2-4 GM/100ML-% IV SOLN
INTRAVENOUS | Status: AC
Start: 2023-08-28 — End: ?
  Filled 2023-08-28: qty 100

## 2023-08-28 MED ORDER — ONDANSETRON HCL 4 MG/2ML IJ SOLN
INTRAMUSCULAR | Status: DC | PRN
  Administered 2023-08-28: 4 mg via INTRAVENOUS

## 2023-08-28 MED ORDER — CHLORHEXIDINE GLUCONATE 0.12 % MT SOLN
OROMUCOSAL | Status: AC
  Filled 2023-08-28: qty 15

## 2023-08-28 MED ORDER — ONDANSETRON HCL 4 MG/2ML IJ SOLN: 4.0000 mg | Freq: Once | INTRAMUSCULAR | Status: DC | PRN

## 2023-08-28 MED ORDER — PROPOFOL 1000 MG/100ML IV EMUL
INTRAVENOUS | Status: AC
Start: 2023-08-28 — End: ?
  Filled 2023-08-28: qty 100

## 2023-08-28 MED ORDER — PROPOFOL 10 MG/ML IV BOLUS
INTRAVENOUS | Status: AC
  Filled 2023-08-28: qty 20

## 2023-08-28 MED ORDER — OXYCODONE HCL 5 MG/5ML PO SOLN: 5.0000 mg | Freq: Once | ORAL | Status: DC | PRN

## 2023-08-28 MED ORDER — ACETAMINOPHEN 10 MG/ML IV SOLN
INTRAVENOUS | Status: DC | PRN
  Administered 2023-08-28: 1000 mg via INTRAVENOUS

## 2023-08-28 MED ORDER — SCOPOLAMINE 1 MG/3DAYS TD PT72
1.0000 | MEDICATED_PATCH | TRANSDERMAL | Status: DC
  Administered 2023-08-28: 1.5 mg via TRANSDERMAL

## 2023-08-28 MED ORDER — OXYCODONE HCL 5 MG PO TABS: 5.0000 mg | ORAL_TABLET | Freq: Once | ORAL | Status: DC | PRN

## 2023-08-28 MED ORDER — ACETAMINOPHEN 10 MG/ML IV SOLN: 1000.0000 mg | Freq: Once | INTRAVENOUS | Status: DC | PRN

## 2023-08-28 MED ORDER — EPINEPHRINE PF 1 MG/ML IJ SOLN
INTRAMUSCULAR | Status: AC
  Filled 2023-08-28: qty 1

## 2023-08-28 MED ORDER — KETAMINE HCL 50 MG/5ML IJ SOSY
PREFILLED_SYRINGE | INTRAMUSCULAR | Status: DC | PRN
  Administered 2023-08-28: 20 mg via INTRAVENOUS

## 2023-08-28 MED ORDER — HYDROCODONE-ACETAMINOPHEN 5-325 MG PO TABS: 1.0000 | ORAL_TABLET | Freq: Four times a day (QID) | ORAL | 0 refills | Status: AC | PRN

## 2023-08-28 SURGICAL SUPPLY — 33 items
BLADE PHOTON ILLUMINATED (MISCELLANEOUS) ×1 IMPLANT
BLADE SURG 15 STRL LF DISP TIS (BLADE) ×1 IMPLANT
CHLORAPREP W/TINT 26 (MISCELLANEOUS) ×1 IMPLANT
DERMABOND ADVANCED .7 DNX12 (GAUZE/BANDAGES/DRESSINGS) ×1 IMPLANT
DEVICE DUBIN SPECIMEN MAMMOGRA (MISCELLANEOUS) ×1 IMPLANT
DRAPE LAPAROTOMY TRNSV 106X77 (MISCELLANEOUS) ×1 IMPLANT
ELECT REM PT RETURN 9FT ADLT (ELECTROSURGICAL) ×1
ELECTRODE REM PT RTRN 9FT ADLT (ELECTROSURGICAL) ×1 IMPLANT
GAUZE 4X4 16PLY ~~LOC~~+RFID DBL (SPONGE) IMPLANT
GLOVE BIOGEL PI IND STRL 7.0 (GLOVE) ×1 IMPLANT
GLOVE SURG SYN 6.5 ES PF (GLOVE) ×1 IMPLANT
GLOVE SURG SYN 6.5 PF PI (GLOVE) ×1 IMPLANT
GOWN STRL REUS W/ TWL LRG LVL3 (GOWN DISPOSABLE) ×2 IMPLANT
KIT MARKER MARGIN INK (KITS) ×1 IMPLANT
KIT TURNOVER KIT A (KITS) ×1 IMPLANT
LABEL OR SOLS (LABEL) ×1 IMPLANT
LIGHT WAVEGUIDE WIDE FLAT (MISCELLANEOUS) IMPLANT
MANIFOLD NEPTUNE II (INSTRUMENTS) ×1 IMPLANT
MARKER MARGIN CORRECT CLIP (MARKER) ×1 IMPLANT
NDL HYPO 22X1.5 SAFETY MO (MISCELLANEOUS) ×2 IMPLANT
NEEDLE HYPO 22X1.5 SAFETY MO (MISCELLANEOUS) ×2 IMPLANT
PACK BASIN MINOR ARMC (MISCELLANEOUS) ×1 IMPLANT
SHEATH BREAST BIOPSY SKIN MKR (SHEATH) ×1 IMPLANT
SUT MNCRL 4-0 27XMFL (SUTURE) ×1
SUT SILK 2-0 30XBRD TIE 12 (SUTURE) IMPLANT
SUT SILK 3 0 12 30 (SUTURE) IMPLANT
SUT VIC AB 3-0 SH 27X BRD (SUTURE) ×1 IMPLANT
SUTURE MNCRL 4-0 27XMF (SUTURE) ×1 IMPLANT
SYR 20ML LL LF (SYRINGE) ×1 IMPLANT
TRAP FLUID SMOKE EVACUATOR (MISCELLANEOUS) ×1 IMPLANT
TRAP NEPTUNE SPECIMEN COLLECT (MISCELLANEOUS) ×1 IMPLANT
WATER STERILE IRR 1000ML POUR (IV SOLUTION) ×1 IMPLANT
WATER STERILE IRR 500ML POUR (IV SOLUTION) ×1 IMPLANT

## 2023-08-28 NOTE — Op Note (Signed)
Preoperative diagnosis: Right breast papilloma .  Postoperative diagnosis: Same.   Procedure: SCOUT tag-localized right breast lumpectomy x 2  Anesthesia: GETA  Surgeon: Dr. Sung Amabile  Wound Classification: Clean  Indications: Patient is a 72 y.o. female with a nonpalpable right breast mass noted on mammography with core biopsy demonstrating papilloma requires SCOUT localizer placement, lumpectomy to confirm diagnosis  Specimen: Right breast mass x 2  Complications: None  Estimated Blood Loss: 30 mL  Findings: 1. Specimen mammography shows marker and SCOUT localizer on specimen 2. Pathology call refers gross examination of margins was negative 3. No other palpable mass or lymph node identified.     Description of procedure: SCOUT localization was performed by radiology prior to procedure. Localization studies were reviewed. The patient was taken to the operating room and placed supine on the operating table, and after general anesthesia the right breast and axilla were prepped and draped in the usual sterile fashion. A time-out was completed verifying correct patient, procedure, site, positioning, and implant(s) and/or special equipment prior to beginning this procedure.  By identifying the SCOUT localizer, the probable trajectory and location of the mass was visualized. A skin incision was planned in such a way as to minimize the amount of dissection to reach the mass.  The skin incision was made after infusion of local. Flaps were raised and  Sharp and blunt dissection was then taken down to the mass, taking care to include the entire SCOUT localizer and a margin of grossly normal tissue. The specimen was removed. The specimen was oriented with paint. Imaging reviewed and the entire target lesion had been resected, with biopsy clip and localizer within the specimen.Gross margin analysis by pathology confirmed all margins cleared on initial inspection.  By identifying the second  SCOUT localizer, the probable trajectory and location of the mass was visualized. A skin incision was planned in such a way as to minimize the amount of dissection to reach the mass.  The skin incision was made after infusion of local. Flaps were raised and  Sharp and blunt dissection was then taken down to the mass, taking care to include the entire SCOUT localizer and a margin of grossly normal tissue. The specimen was removed. The specimen was oriented with paint. Imaging reviewed and the entire target lesion had been resected, with biopsy clip and localizer within the specimen.Gross margin analysis by pathology confirmed all margins cleared on initial inspection.  Both wounds irrigated, hemostasis was achieved and the wounds closed with a running subcuticular suture of Monocryl 4-0, then dressed with dermabond.  The patient tolerated the procedure well and was taken to the postanesthesia care unit in stable condition. Sponge and instrument count correct at end of procedure.

## 2023-08-28 NOTE — Interval H&P Note (Signed)
No change. OK to proceed.

## 2023-08-28 NOTE — Discharge Instructions (Signed)
Removal, Care After This sheet gives you information about how to care for yourself after your procedure. Your health care provider may also give you more specific instructions. If you have problems or questions, contact your health care provider. What can I expect after the procedure? After the procedure, it is common to have: Soreness. Bruising. Itching. Follow these instructions at home: site care Follow instructions from your health care provider about how to take care of your site. Make sure you: Wash your hands with soap and water before and after you change your bandage (dressing). If soap and water are not available, use hand sanitizer. Leave stitches (sutures), skin glue, or adhesive strips in place. These skin closures may need to stay in place for 2 weeks or longer. If adhesive strip edges start to loosen and curl up, you may trim the loose edges. Do not remove adhesive strips completely unless your health care provider tells you to do that. If the area bleeds or bruises, apply gentle pressure for 10 minutes. OK TO SHOWER IN 24HRS  Check your site every day for signs of infection. Check for: Redness, swelling, or pain. Fluid or blood. Warmth. Pus or a bad smell.  General instructions Rest and then return to your normal activities as told by your health care provider.  tylenol and advil as needed for discomfort.  Please alternate between the two every four hours as needed for pain.    Use narcotics, if prescribed, only when tylenol and motrin is not enough to control pain.  325-650mg every 8hrs to max of 3000mg/24hrs (including the 325mg in every norco dose) for the tylenol.    Advil up to 800mg per dose every 8hrs as needed for pain.   Keep all follow-up visits as told by your health care provider. This is important. Contact a health care provider if: You have redness, swelling, or pain around your site. You have fluid or blood coming from your site. Your site feels warm to  the touch. You have pus or a bad smell coming from your site. You have a fever. Your sutures, skin glue, or adhesive strips loosen or come off sooner than expected. Get help right away if: You have bleeding that does not stop with pressure or a dressing. Summary After the procedure, it is common to have some soreness, bruising, and itching at the site. Follow instructions from your health care provider about how to take care of your site. Check your site every day for signs of infection. Contact a health care provider if you have redness, swelling, or pain around your site, or your site feels warm to the touch. Keep all follow-up visits as told by your health care provider. This is important. This information is not intended to replace advice given to you by your health care provider. Make sure you discuss any questions you have with your health care provider. Document Released: 10/19/2015 Document Revised: 03/22/2018 Document Reviewed: 03/22/2018 Elsevier Interactive Patient Education  2019 [employer]   

## 2023-08-28 NOTE — Anesthesia Procedure Notes (Signed)
Procedure Name: LMA Insertion Date/Time: 08/28/2023 12:26 PM  Performed by: Katherine Basset, CRNAPre-anesthesia Checklist: Patient identified, Emergency Drugs available, Suction available and Patient being monitored Patient Re-evaluated:Patient Re-evaluated prior to induction Oxygen Delivery Method: Circle system utilized Preoxygenation: Pre-oxygenation with 100% oxygen Induction Type: IV induction LMA: LMA inserted LMA Size: 4.0 Number of attempts: 1 Placement Confirmation: positive ETCO2 and breath sounds checked- equal and bilateral Tube secured with: Tape Dental Injury: Teeth and Oropharynx as per pre-operative assessment

## 2023-08-28 NOTE — Anesthesia Preprocedure Evaluation (Addendum)
Anesthesia Evaluation  Patient identified by MRN, date of birth, ID band Patient awake    Reviewed: Allergy & Precautions, NPO status , Patient's Chart, lab work & pertinent test results  History of Anesthesia Complications (+) PONV and history of anesthetic complications  Airway Mallampati: IV   Neck ROM: Full    Dental   Crowns :   Pulmonary sleep apnea , former smoker (quit 1985)   Pulmonary exam normal breath sounds clear to auscultation       Cardiovascular Exercise Tolerance: Good Normal cardiovascular exam Rhythm:Regular Rate:Normal  ECG 08/25/23: normal  Echo 11/18/18:  Normal Stress Echocardiogram  NORMAL RIGHT VENTRICULAR SYSTOLIC FUNCTION  MILD VALVULAR REGURGITATION  NO VALVULAR STENOSIS NOTED  Resting EF: >55% (Est.)  Post Stress EF: >55% (Est.)  ECG Results: Normal  Mitral: MILD MR  Tricuspid: MILD TR    Neuro/Psych  PSYCHIATRIC DISORDERS Anxiety Depression    negative neurological ROS     GI/Hepatic ,GERD  ,,  Endo/Other  Hypothyroidism    Renal/GU negative Renal ROS     Musculoskeletal  (+) Arthritis ,    Abdominal   Peds  Hematology negative hematology ROS (+)   Anesthesia Other Findings   Reproductive/Obstetrics                             Anesthesia Physical Anesthesia Plan  ASA: 2  Anesthesia Plan: General   Post-op Pain Management:    Induction: Intravenous  PONV Risk Score and Plan: 4 or greater and Ondansetron, Dexamethasone, Treatment may vary due to age or medical condition and Scopolamine patch - Pre-op  Airway Management Planned: LMA  Additional Equipment:   Intra-op Plan:   Post-operative Plan: Extubation in OR  Informed Consent: I have reviewed the patients History and Physical, chart, labs and discussed the procedure including the risks, benefits and alternatives for the proposed anesthesia with the patient or authorized representative  who has indicated his/her understanding and acceptance.     Dental advisory given  Plan Discussed with: CRNA  Anesthesia Plan Comments: (Patient consented for risks of anesthesia including but not limited to:  - adverse reactions to medications - damage to eyes, teeth, lips or other oral mucosa - nerve damage due to positioning  - sore throat or hoarseness - damage to heart, brain, nerves, lungs, other parts of body or loss of life  Informed patient about role of CRNA in peri- and intra-operative care.  Patient voiced understanding.)        Anesthesia Quick Evaluation

## 2023-08-28 NOTE — H&P (Signed)
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 female who was referred by Sandie Ano, MD for evaluation of above. Noted bloody nipple discharge recently. Never had biopsy previously. Discharge seems to have decreased slightly  Past Medical History: has a past medical history of Anxiety, Arthritis, GERD (gastroesophageal reflux disease), Hyperlipidemia, and Hypothyroidism.  Past Surgical History: has a past surgical history that includes Hysterectomy; Joint replacement (Right); Joint replacement (Left); Cholecystectomy; WISDOM TEETH; Colonoscopy (08/24/2015); and Colonoscopy (09/24/2020).  Family History: family history includes Alcohol abuse in her brother and father; Breast cancer in her maternal aunt; Heart disease in her father; Kidney cancer in her mother; Melanoma (age of onset: 67) in her son; Myocardial Infarction (Heart attack) in her father.  Social History: reports that she has quit smoking. She has never been exposed to tobacco smoke. She has never used smokeless tobacco. She reports current alcohol use. She reports that she does not use drugs.  Current Medications: has a current medication list which includes the following prescription(s): np thyroid, omeprazole, sertraline, and simvastatin.  Allergies:  Allergies as of 08/19/2023  (No Known Allergies)   ROS:  A 15 point review of systems was performed and was negative except as noted in HPI  Objective:    BP (!) 146/83  Pulse 57  Ht 172.8 cm (5' 8.03")  Wt 81.6 kg (179 lb 14.3 oz)  BMI 27.33 kg/m   Constitutional : No distress, cooperative, alert  Lymphatics/Throat: Supple with no lymphadenopathy  Respiratory: Clear to auscultation bilaterally  Cardiovascular: Regular rate and rhythm  Gastrointestinal: Soft, non-tender, non-distended, no organomegaly.  Musculoskeletal: Steady gait and movement  Skin: Cool and moist.  Psychiatric: Normal affect, non-agitated, not confused   Breast: Bloody nipple discharge present on right, with possible palpable mass deep to it. Chaperone present for exam.    LABS:  N/a   RADS: CLINICAL DATA: 72 year old female presenting for evaluation of  unilateral spontaneous right nipple discharge which is bloody in  color and coming from 1 duct. She says that her right nipple has  been red with crusting.   EXAM:  DIGITAL DIAGNOSTIC BILATERAL MAMMOGRAM WITH TOMOSYNTHESIS AND CAD;  ULTRASOUND RIGHT BREAST LIMITED   TECHNIQUE:  Bilateral digital diagnostic mammography and breast tomosynthesis  was performed. The images were evaluated with computer-aided  detection. ; Targeted ultrasound examination of the right breast was  performed   COMPARISON: Previous exam(s).   ACR Breast Density Category b: There are scattered areas of  fibroglandular density.   FINDINGS:  There is a small oval mass in the upper inner quadrant of the right  breast. No suspicious mammographic findings are identified on the  spot compression tomosynthesis images in the retroareolar right  breast. No other suspicious calcifications, masses or areas of  distortion are seen in the bilateral breasts.   Ultrasound targeted to the retroareolar right breast demonstrates a  subtle oval hypoechoic mass within the nipple measuring 9 x 5 x 7  mm. There is a small tail of this mass that extends into a slightly  dilated duct extending out from the base of the nipple.   Ultrasound of the right breast at 1 o'clock, 4 cm from the nipple  demonstrates a near anechoic oval mass with indistinct margins  measuring 6 x 6 x 5 mm.   Ultrasound of the retroareolar right breast at 8 o'clock  demonstrates an irregular possibly intraductal mass measuring 10 x 5  x 6 mm.  Ultrasound of the right axilla demonstrates multiple  normal-appearing lymph nodes.   IMPRESSION:  1. There is a 9 mm mass within the right nipple with a small tail  extending into a slightly dilated  duct at the base of the nipple.   2. There is a possible intraductal mass in the right breast at 1  o'clock measuring 6 mm.   3. There is a 10 mm mass in the right breast at 8 o'clock, possibly  representing a cyst.   4. No evidence of right axillary lymphadenopathy.   RECOMMENDATION:  1. Ultrasound-guided aspiration versus biopsy is recommended for the  right breast mass at 8 o'clock. Ultrasound-guided biopsy is  recommended for the right breast mass at 1 o'clock.   2. If either of these masses represents a papilloma, surgical  consultation would be recommended, which should also address the  mass that is within the nipple. If neither of these masses are  papillomas, than attempted ultrasound-guided biopsy could be  performed of the tail of the mass that extends to the base of the  nipple. MRI may also be performed for further evaluation (and would  be recommended if the mass at the base of the nipple cannot be  biopsied) followed by surgical consultation for bloody nipple  discharge.   I have discussed the findings and recommendations with the patient.  If applicable, a reminder letter will be sent to the patient  regarding the next appointment.   BI-RADS CATEGORY 4: Suspicious.   Electronically Signed  By: Frederico Hamman M.D.  On: 07/24/2023 15:50   Addendum by Glennon Mac, MD on 08/18/2023 2:57 PM EST  ADDENDUM REPORT: 08/18/2023 12:57   ADDENDUM:  PATHOLOGY revealed: Site 1. Breast, right, needle core biopsy,  retroareolar, 8 o'clock, heart clip : - INTRADUCTAL PAPILLOMA. -  VASCULAR CALCIFICATION - NEGATIVE FOR ATYPIA AND MALIGNANCY.   Pathology results are CONCORDANT with imaging findings, per Dr.  Meda Klinefelter with excision recommended.   PATHOLOGY revealed: Site 2. 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 Diagnosis Note : Mucocele-like lesions  are areas of mucin extrusion into breast tissue and are usually  associated with a ruptured cyst or dilated duct with mucinous  luminal contents. 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 Correlation with radiographic  imaging and clinical impression is required.   Pathology results are CONCORDANT with imaging findings, per Dr.  Meda Klinefelter with excision recommended.   Pathology results and recommendations below were discussed with  patient by telephone on 08/18/2023. Patient reported biopsy site  within normal limits with slight tenderness at the site. Post biopsy  care instructions were reviewed, questions were answered and my  direct phone number was provided to patient. Patient was instructed  to call [facility] if any concerns or questions arise  related to the biopsy.   RECOMMENDATION: 1. Surgical consultation for consideration of  excision for both sites 1 and 2 for bloody nipple discharge. The  mass in the nipple is most likely not amenable to US biopsy and  would be best addressed surgically versus surveillance. MRI could be  considered for further imaging. Request for surgical consultation  relayed to Irving Shows RN at [facility] by Randa Lynn RN on 08/18/2023.   Pathology results reported by Randa Lynn  RN on 08/18/2023.   Electronically Signed  By: Meda Klinefelter M.D.  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  Plan:    1. 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 The risks of general anesthetic, if used, includes MI, CVA, sudden death or even reaction to anesthetic  medications also discussed.  Typical post-op recovery time and possbility of activity restrictions were also discussed. Alternatives include continued observation. Benefits include possible symptom relief, pathologic evaluation, and/or curative excision.   The patient verbalized understanding and all questions were answered to the patient's satisfaction.  2. Patient has elected to proceed with surgical treatment. Procedure will be scheduled. RIGHT. will proceed with removal of both lumps. SCOUT tag placement prior. No need for lymph node biopsy [id number]  labs/images/medications/previous chart entries reviewed personally and relevant changes/updates noted above.

## 2023-08-28 NOTE — Transfer of Care (Signed)
Immediate Anesthesia Transfer of Care Note  Patient: Colleen Conley  Procedure(s) Performed: BREAST LUMPECTOMY WITH RADIO FREQUENCY LOCALIZER x2 (Right: Breast)  Patient Location: PACU  Anesthesia Type:General  Level of Consciousness: awake, alert , and oriented  Airway & Oxygen Therapy: Patient Spontanous Breathing and Patient connected to face mask oxygen  Post-op Assessment: Report given to RN, Post -op Vital signs reviewed and stable, and Patient moving all extremities  Post vital signs: Reviewed and stable  Last Vitals:  Vitals Value Taken Time  BP 115/61 08/28/23 1346  Temp 36.3 C 08/28/23 1344  Pulse 68 08/28/23 1350  Resp 19 08/28/23 1350  SpO2 93 % 08/28/23 1350  Vitals shown include unfiled device data.  Last Pain:  Vitals:   08/28/23 1344  TempSrc:   PainSc: 0-No pain         Complications: No notable events documented.

## 2023-08-31 ENCOUNTER — Encounter: Payer: Self-pay | Admitting: Surgery

## 2023-08-31 NOTE — Anesthesia Postprocedure Evaluation (Signed)
Anesthesia Post Note  Patient: Colleen Conley  Procedure(s) Performed: BREAST LUMPECTOMY WITH RADIO FREQUENCY LOCALIZER x2 (Right: Breast)  Patient location during evaluation: PACU Anesthesia Type: General Level of consciousness: awake and alert, oriented and patient cooperative Pain management: pain level controlled Vital Signs Assessment: post-procedure vital signs reviewed and stable Respiratory status: spontaneous breathing, nonlabored ventilation and respiratory function stable Cardiovascular status: blood pressure returned to baseline and stable Postop Assessment: adequate PO intake Anesthetic complications: no   No notable events documented.   Last Vitals:  Vitals:   08/28/23 1400 08/28/23 1418  BP: 111/62 (!) 118/55  Pulse: (!) 57 70  Resp: 15 16  Temp: (!) 36.1 C (!) 36.1 C  SpO2: 92% 94%    Last Pain:  Vitals:   08/28/23 1418  TempSrc: Temporal  PainSc: 0-No pain                 Reed Breech

## 2023-09-02 LAB — SURGICAL PATHOLOGY
